# Patient Record
Sex: Female | Born: 1935 | Race: White | Hispanic: No | Smoking: Never smoker
Health system: Southern US, Community
[De-identification: ages and names within clinical notes are randomized; demographics above are authoritative.]

## PROBLEM LIST (undated history)

## (undated) DIAGNOSIS — I509 Heart failure, unspecified: Secondary | ICD-10-CM

## (undated) DIAGNOSIS — I499 Cardiac arrhythmia, unspecified: Secondary | ICD-10-CM

## (undated) DIAGNOSIS — I219 Acute myocardial infarction, unspecified: Secondary | ICD-10-CM

## (undated) DIAGNOSIS — I2510 Atherosclerotic heart disease of native coronary artery without angina pectoris: Secondary | ICD-10-CM

## (undated) DIAGNOSIS — I739 Peripheral vascular disease, unspecified: Secondary | ICD-10-CM

## (undated) DIAGNOSIS — C8590 Non-Hodgkin lymphoma, unspecified, unspecified site: Secondary | ICD-10-CM

## (undated) DIAGNOSIS — Z21 Asymptomatic human immunodeficiency virus [HIV] infection status: Secondary | ICD-10-CM

## (undated) DIAGNOSIS — B20 Human immunodeficiency virus [HIV] disease: Secondary | ICD-10-CM

## (undated) DIAGNOSIS — N189 Chronic kidney disease, unspecified: Secondary | ICD-10-CM

## (undated) HISTORY — PX: JOINT REPLACEMENT: SHX530

## (undated) HISTORY — DX: Chronic kidney disease, unspecified: N18.9

## (undated) HISTORY — DX: Asymptomatic human immunodeficiency virus (hiv) infection status: Z21

## (undated) HISTORY — DX: Cardiac arrhythmia, unspecified: I49.9

## (undated) HISTORY — DX: Human immunodeficiency virus (HIV) disease: B20

## (undated) HISTORY — DX: Acute myocardial infarction, unspecified: I21.9

## (undated) HISTORY — DX: Atherosclerotic heart disease of native coronary artery without angina pectoris: I25.10

## (undated) HISTORY — DX: Heart failure, unspecified: I50.9

## (undated) HISTORY — PX: CORONARY ARTERY BYPASS GRAFT: SHX141

## (undated) HISTORY — DX: Peripheral vascular disease, unspecified: I73.9

---

## 1998-08-22 ENCOUNTER — Ambulatory Visit (HOSPITAL_COMMUNITY): Admission: RE | Admit: 1998-08-22 | Discharge: 1998-08-22 | Payer: Self-pay

## 1998-09-26 ENCOUNTER — Inpatient Hospital Stay (HOSPITAL_COMMUNITY): Admission: EM | Admit: 1998-09-26 | Discharge: 1998-09-30 | Payer: Self-pay | Admitting: Emergency Medicine

## 1998-09-26 ENCOUNTER — Encounter: Payer: Self-pay | Admitting: Emergency Medicine

## 1998-09-27 ENCOUNTER — Encounter: Payer: Self-pay | Admitting: *Deleted

## 1999-03-02 ENCOUNTER — Other Ambulatory Visit: Admission: RE | Admit: 1999-03-02 | Discharge: 1999-03-02 | Payer: Self-pay | Admitting: Family Medicine

## 1999-09-28 ENCOUNTER — Ambulatory Visit (HOSPITAL_COMMUNITY): Admission: RE | Admit: 1999-09-28 | Discharge: 1999-09-28 | Payer: Self-pay | Admitting: Family Medicine

## 2000-02-24 ENCOUNTER — Ambulatory Visit (HOSPITAL_COMMUNITY): Admission: RE | Admit: 2000-02-24 | Discharge: 2000-02-24 | Payer: Self-pay | Admitting: Family Medicine

## 2000-03-30 ENCOUNTER — Ambulatory Visit (HOSPITAL_COMMUNITY): Admission: RE | Admit: 2000-03-30 | Discharge: 2000-03-30 | Payer: Self-pay | Admitting: Gastroenterology

## 2000-03-30 ENCOUNTER — Encounter (INDEPENDENT_AMBULATORY_CARE_PROVIDER_SITE_OTHER): Payer: Self-pay | Admitting: Specialist

## 2000-05-17 ENCOUNTER — Emergency Department (HOSPITAL_COMMUNITY): Admission: EM | Admit: 2000-05-17 | Discharge: 2000-05-17 | Payer: Self-pay | Admitting: *Deleted

## 2000-10-03 ENCOUNTER — Encounter: Payer: Self-pay | Admitting: Family Medicine

## 2000-10-03 ENCOUNTER — Ambulatory Visit (HOSPITAL_COMMUNITY): Admission: RE | Admit: 2000-10-03 | Discharge: 2000-10-03 | Payer: Self-pay | Admitting: Family Medicine

## 2000-10-07 ENCOUNTER — Encounter: Payer: Self-pay | Admitting: Family Medicine

## 2000-10-07 ENCOUNTER — Encounter: Admission: RE | Admit: 2000-10-07 | Discharge: 2000-10-07 | Payer: Self-pay | Admitting: Family Medicine

## 2001-01-06 ENCOUNTER — Encounter: Admission: RE | Admit: 2001-01-06 | Discharge: 2001-01-06 | Payer: Self-pay | Admitting: Family Medicine

## 2001-01-06 ENCOUNTER — Encounter: Payer: Self-pay | Admitting: Family Medicine

## 2001-10-10 ENCOUNTER — Encounter: Payer: Self-pay | Admitting: Family Medicine

## 2001-10-10 ENCOUNTER — Ambulatory Visit (HOSPITAL_COMMUNITY): Admission: RE | Admit: 2001-10-10 | Discharge: 2001-10-10 | Payer: Self-pay | Admitting: Family Medicine

## 2002-03-07 ENCOUNTER — Other Ambulatory Visit: Admission: RE | Admit: 2002-03-07 | Discharge: 2002-03-07 | Payer: Self-pay | Admitting: Family Medicine

## 2002-04-04 ENCOUNTER — Encounter: Admission: RE | Admit: 2002-04-04 | Discharge: 2002-04-04 | Payer: Self-pay | Admitting: Gastroenterology

## 2002-04-04 ENCOUNTER — Encounter: Payer: Self-pay | Admitting: Gastroenterology

## 2002-12-26 ENCOUNTER — Encounter: Payer: Self-pay | Admitting: Family Medicine

## 2002-12-26 ENCOUNTER — Ambulatory Visit (HOSPITAL_COMMUNITY): Admission: RE | Admit: 2002-12-26 | Discharge: 2002-12-26 | Payer: Self-pay | Admitting: Family Medicine

## 2003-04-10 ENCOUNTER — Ambulatory Visit (HOSPITAL_COMMUNITY): Admission: RE | Admit: 2003-04-10 | Discharge: 2003-04-10 | Payer: Self-pay | Admitting: Gastroenterology

## 2003-04-10 ENCOUNTER — Encounter (INDEPENDENT_AMBULATORY_CARE_PROVIDER_SITE_OTHER): Payer: Self-pay | Admitting: Specialist

## 2003-10-30 ENCOUNTER — Inpatient Hospital Stay (HOSPITAL_BASED_OUTPATIENT_CLINIC_OR_DEPARTMENT_OTHER): Admission: RE | Admit: 2003-10-30 | Discharge: 2003-10-30 | Payer: Self-pay | Admitting: Interventional Cardiology

## 2004-03-11 ENCOUNTER — Ambulatory Visit (HOSPITAL_COMMUNITY): Admission: RE | Admit: 2004-03-11 | Discharge: 2004-03-11 | Payer: Self-pay | Admitting: Family Medicine

## 2004-05-27 ENCOUNTER — Other Ambulatory Visit: Admission: RE | Admit: 2004-05-27 | Discharge: 2004-05-27 | Payer: Self-pay | Admitting: Family Medicine

## 2004-12-03 ENCOUNTER — Encounter: Admission: RE | Admit: 2004-12-03 | Discharge: 2004-12-03 | Payer: Self-pay | Admitting: Interventional Cardiology

## 2005-08-10 ENCOUNTER — Ambulatory Visit (HOSPITAL_COMMUNITY): Admission: RE | Admit: 2005-08-10 | Discharge: 2005-08-10 | Payer: Self-pay | Admitting: Family Medicine

## 2006-07-15 ENCOUNTER — Other Ambulatory Visit: Admission: RE | Admit: 2006-07-15 | Discharge: 2006-07-15 | Payer: Self-pay | Admitting: Family Medicine

## 2006-08-30 ENCOUNTER — Ambulatory Visit (HOSPITAL_COMMUNITY): Admission: RE | Admit: 2006-08-30 | Discharge: 2006-08-30 | Payer: Self-pay | Admitting: Family Medicine

## 2008-02-26 ENCOUNTER — Encounter: Admission: RE | Admit: 2008-02-26 | Discharge: 2008-02-26 | Payer: Self-pay | Admitting: Family Medicine

## 2008-05-23 ENCOUNTER — Encounter (INDEPENDENT_AMBULATORY_CARE_PROVIDER_SITE_OTHER): Payer: Self-pay | Admitting: Family Medicine

## 2008-05-23 ENCOUNTER — Ambulatory Visit: Payer: Self-pay | Admitting: Vascular Surgery

## 2008-05-23 ENCOUNTER — Ambulatory Visit (HOSPITAL_COMMUNITY): Admission: RE | Admit: 2008-05-23 | Discharge: 2008-05-23 | Payer: Self-pay | Admitting: Family Medicine

## 2008-11-20 HISTORY — PX: FRACTURE SURGERY: SHX138

## 2009-02-14 ENCOUNTER — Encounter: Admission: RE | Admit: 2009-02-14 | Discharge: 2009-02-14 | Payer: Self-pay | Admitting: Family Medicine

## 2009-02-25 ENCOUNTER — Ambulatory Visit (HOSPITAL_COMMUNITY): Admission: RE | Admit: 2009-02-25 | Discharge: 2009-02-26 | Payer: Self-pay | Admitting: Neurological Surgery

## 2009-02-25 ENCOUNTER — Encounter (INDEPENDENT_AMBULATORY_CARE_PROVIDER_SITE_OTHER): Payer: Self-pay | Admitting: Neurological Surgery

## 2009-02-27 ENCOUNTER — Ambulatory Visit: Payer: Self-pay | Admitting: Oncology

## 2009-03-03 LAB — LACTATE DEHYDROGENASE: LDH: 214 U/L (ref 94–250)

## 2009-03-03 LAB — CBC WITH DIFFERENTIAL/PLATELET
Basophils Absolute: 0 10*3/uL (ref 0.0–0.1)
Eosinophils Absolute: 0.2 10*3/uL (ref 0.0–0.5)
HGB: 12.1 g/dL (ref 11.6–15.9)
MCV: 79.5 fL (ref 79.5–101.0)
MONO#: 0.6 10*3/uL (ref 0.1–0.9)
MONO%: 8.3 % (ref 0.0–14.0)
NEUT#: 4.8 10*3/uL (ref 1.5–6.5)
RDW: 16.3 % — ABNORMAL HIGH (ref 11.2–14.5)
WBC: 6.8 10*3/uL (ref 3.9–10.3)
lymph#: 1.2 10*3/uL (ref 0.9–3.3)

## 2009-03-03 LAB — COMPREHENSIVE METABOLIC PANEL WITH GFR
ALT: 13 U/L (ref 0–35)
AST: 22 U/L (ref 0–37)
Albumin: 4 g/dL (ref 3.5–5.2)
Alkaline Phosphatase: 84 U/L (ref 39–117)
BUN: 21 mg/dL (ref 6–23)
CO2: 28 meq/L (ref 19–32)
Calcium: 9.4 mg/dL (ref 8.4–10.5)
Chloride: 102 meq/L (ref 96–112)
Creatinine, Ser: 1.1 mg/dL (ref 0.40–1.20)
Glucose, Bld: 105 mg/dL — ABNORMAL HIGH (ref 70–99)
Potassium: 3.8 meq/L (ref 3.5–5.3)
Sodium: 142 meq/L (ref 135–145)
Total Bilirubin: 0.6 mg/dL (ref 0.3–1.2)
Total Protein: 6.5 g/dL (ref 6.0–8.3)

## 2009-03-04 ENCOUNTER — Ambulatory Visit: Admission: RE | Admit: 2009-03-04 | Discharge: 2009-04-15 | Payer: Self-pay | Admitting: Radiation Oncology

## 2009-03-07 ENCOUNTER — Ambulatory Visit (HOSPITAL_COMMUNITY): Admission: RE | Admit: 2009-03-07 | Discharge: 2009-03-07 | Payer: Self-pay | Admitting: Internal Medicine

## 2009-03-09 ENCOUNTER — Encounter: Admission: RE | Admit: 2009-03-09 | Discharge: 2009-03-09 | Payer: Self-pay | Admitting: Radiation Oncology

## 2009-03-10 ENCOUNTER — Encounter (INDEPENDENT_AMBULATORY_CARE_PROVIDER_SITE_OTHER): Payer: Self-pay | Admitting: Diagnostic Radiology

## 2009-03-10 ENCOUNTER — Ambulatory Visit (HOSPITAL_COMMUNITY): Admission: RE | Admit: 2009-03-10 | Discharge: 2009-03-10 | Payer: Self-pay | Admitting: Radiation Oncology

## 2009-03-17 LAB — CBC WITH DIFFERENTIAL/PLATELET
BASO%: 0 % (ref 0.0–2.0)
Eosinophils Absolute: 0.1 10*3/uL (ref 0.0–0.5)
MCHC: 32.9 g/dL (ref 31.5–36.0)
MONO#: 0 10*3/uL — ABNORMAL LOW (ref 0.1–0.9)
NEUT#: 0.2 10*3/uL — CL (ref 1.5–6.5)
Platelets: 122 10*3/uL — ABNORMAL LOW (ref 145–400)
RBC: 4.32 10*6/uL (ref 3.70–5.45)
WBC: 0.6 10*3/uL — CL (ref 3.9–10.3)
lymph#: 0.3 10*3/uL — ABNORMAL LOW (ref 0.9–3.3)
nRBC: 0 % (ref 0–0)

## 2009-03-17 LAB — COMPREHENSIVE METABOLIC PANEL
ALT: 29 U/L (ref 0–35)
AST: 17 U/L (ref 0–37)
Albumin: 3.4 g/dL — ABNORMAL LOW (ref 3.5–5.2)
CO2: 32 mEq/L (ref 19–32)
Calcium: 8.8 mg/dL (ref 8.4–10.5)
Chloride: 97 mEq/L (ref 96–112)
Potassium: 3.4 mEq/L — ABNORMAL LOW (ref 3.5–5.3)
Sodium: 138 mEq/L (ref 135–145)
Total Protein: 5.6 g/dL — ABNORMAL LOW (ref 6.0–8.3)

## 2009-03-17 LAB — LACTATE DEHYDROGENASE: LDH: 137 U/L (ref 94–250)

## 2009-03-24 LAB — COMPREHENSIVE METABOLIC PANEL
AST: 16 U/L (ref 0–37)
Alkaline Phosphatase: 90 U/L (ref 39–117)
BUN: 14 mg/dL (ref 6–23)
Glucose, Bld: 104 mg/dL — ABNORMAL HIGH (ref 70–99)
Sodium: 137 mEq/L (ref 135–145)
Total Bilirubin: 0.5 mg/dL (ref 0.3–1.2)
Total Protein: 6 g/dL (ref 6.0–8.3)

## 2009-03-24 LAB — CBC WITH DIFFERENTIAL/PLATELET
EOS%: 0.2 % (ref 0.0–7.0)
Eosinophils Absolute: 0 10*3/uL (ref 0.0–0.5)
LYMPH%: 5.2 % — ABNORMAL LOW (ref 14.0–49.7)
MCH: 26.3 pg (ref 25.1–34.0)
MCHC: 33 g/dL (ref 31.5–36.0)
MCV: 79.7 fL (ref 79.5–101.0)
MONO%: 6 % (ref 0.0–14.0)
NEUT#: 10.6 10*3/uL — ABNORMAL HIGH (ref 1.5–6.5)
Platelets: 234 10*3/uL (ref 145–400)
RBC: 4.39 10*6/uL (ref 3.70–5.45)

## 2009-04-01 LAB — COMPREHENSIVE METABOLIC PANEL
CO2: 30 mEq/L (ref 19–32)
Calcium: 9.7 mg/dL (ref 8.4–10.5)
Creatinine, Ser: 1.86 mg/dL — ABNORMAL HIGH (ref 0.40–1.20)
Glucose, Bld: 128 mg/dL — ABNORMAL HIGH (ref 70–99)
Total Bilirubin: 0.4 mg/dL (ref 0.3–1.2)
Total Protein: 6.1 g/dL (ref 6.0–8.3)

## 2009-04-01 LAB — CBC WITH DIFFERENTIAL/PLATELET
Basophils Absolute: 0 10*3/uL (ref 0.0–0.1)
Eosinophils Absolute: 0 10*3/uL (ref 0.0–0.5)
HCT: 34.1 % — ABNORMAL LOW (ref 34.8–46.6)
HGB: 11.2 g/dL — ABNORMAL LOW (ref 11.6–15.9)
LYMPH%: 8.4 % — ABNORMAL LOW (ref 14.0–49.7)
MONO#: 0.8 10*3/uL (ref 0.1–0.9)
NEUT#: 7.4 10*3/uL — ABNORMAL HIGH (ref 1.5–6.5)
NEUT%: 82.4 % — ABNORMAL HIGH (ref 38.4–76.8)
Platelets: 591 10*3/uL — ABNORMAL HIGH (ref 145–400)
WBC: 9 10*3/uL (ref 3.9–10.3)

## 2009-04-01 LAB — LACTATE DEHYDROGENASE: LDH: 155 U/L (ref 94–250)

## 2009-04-09 LAB — CBC WITH DIFFERENTIAL/PLATELET
Basophils Absolute: 0 10*3/uL (ref 0.0–0.1)
Eosinophils Absolute: 0.1 10*3/uL (ref 0.0–0.5)
HCT: 27.9 % — ABNORMAL LOW (ref 34.8–46.6)
HGB: 9.5 g/dL — ABNORMAL LOW (ref 11.6–15.9)
MONO#: 0 10*3/uL — ABNORMAL LOW (ref 0.1–0.9)
NEUT%: 14.9 % — ABNORMAL LOW (ref 38.4–76.8)
WBC: 0.4 10*3/uL — CL (ref 3.9–10.3)
lymph#: 0.2 10*3/uL — ABNORMAL LOW (ref 0.9–3.3)

## 2009-04-09 LAB — COMPREHENSIVE METABOLIC PANEL
ALT: 20 U/L (ref 0–35)
BUN: 34 mg/dL — ABNORMAL HIGH (ref 6–23)
CO2: 28 mEq/L (ref 19–32)
Calcium: 8.3 mg/dL — ABNORMAL LOW (ref 8.4–10.5)
Chloride: 99 mEq/L (ref 96–112)
Creatinine, Ser: 0.99 mg/dL (ref 0.40–1.20)

## 2009-04-09 LAB — LACTATE DEHYDROGENASE: LDH: 112 U/L (ref 94–250)

## 2009-04-14 ENCOUNTER — Ambulatory Visit: Payer: Self-pay | Admitting: Oncology

## 2009-04-16 LAB — COMPREHENSIVE METABOLIC PANEL
AST: 149 U/L — ABNORMAL HIGH (ref 0–37)
Albumin: 3.1 g/dL — ABNORMAL LOW (ref 3.5–5.2)
Alkaline Phosphatase: 120 U/L — ABNORMAL HIGH (ref 39–117)
Potassium: 4.3 mEq/L (ref 3.5–5.3)
Sodium: 140 mEq/L (ref 135–145)
Total Protein: 5.1 g/dL — ABNORMAL LOW (ref 6.0–8.3)

## 2009-04-16 LAB — CBC WITH DIFFERENTIAL/PLATELET
BASO%: 0.6 % (ref 0.0–2.0)
EOS%: 0.5 % (ref 0.0–7.0)
HGB: 8.5 g/dL — ABNORMAL LOW (ref 11.6–15.9)
MCH: 25.8 pg (ref 25.1–34.0)
MCHC: 32.3 g/dL (ref 31.5–36.0)
RBC: 3.29 10*6/uL — ABNORMAL LOW (ref 3.70–5.45)
RDW: 18.9 % — ABNORMAL HIGH (ref 11.2–14.5)
lymph#: 0.7 10*3/uL — ABNORMAL LOW (ref 0.9–3.3)

## 2009-04-16 LAB — TECHNOLOGIST REVIEW: Technologist Review: 2

## 2009-04-22 LAB — CBC WITH DIFFERENTIAL/PLATELET
BASO%: 0.1 % (ref 0.0–2.0)
Eosinophils Absolute: 0 10*3/uL (ref 0.0–0.5)
HCT: 32.2 % — ABNORMAL LOW (ref 34.8–46.6)
LYMPH%: 3 % — ABNORMAL LOW (ref 14.0–49.7)
MCHC: 34.5 g/dL (ref 31.5–36.0)
MCV: 82.4 fL (ref 79.5–101.0)
MONO%: 6.2 % (ref 0.0–14.0)
NEUT%: 90.1 % — ABNORMAL HIGH (ref 38.4–76.8)
Platelets: 420 10*3/uL — ABNORMAL HIGH (ref 145–400)
RBC: 3.91 10*6/uL (ref 3.70–5.45)

## 2009-04-22 LAB — COMPREHENSIVE METABOLIC PANEL
Alkaline Phosphatase: 112 U/L (ref 39–117)
Creatinine, Ser: 0.84 mg/dL (ref 0.40–1.20)
Glucose, Bld: 127 mg/dL — ABNORMAL HIGH (ref 70–99)
Sodium: 141 mEq/L (ref 135–145)
Total Bilirubin: 1 mg/dL (ref 0.3–1.2)
Total Protein: 5.4 g/dL — ABNORMAL LOW (ref 6.0–8.3)

## 2009-04-30 ENCOUNTER — Inpatient Hospital Stay (HOSPITAL_COMMUNITY): Admission: EM | Admit: 2009-04-30 | Discharge: 2009-05-07 | Payer: Self-pay | Admitting: Emergency Medicine

## 2009-04-30 ENCOUNTER — Ambulatory Visit: Payer: Self-pay | Admitting: Internal Medicine

## 2009-04-30 LAB — COMPREHENSIVE METABOLIC PANEL
AST: 14 U/L (ref 0–37)
Alkaline Phosphatase: 74 U/L (ref 39–117)
BUN: 30 mg/dL — ABNORMAL HIGH (ref 6–23)
Creatinine, Ser: 0.85 mg/dL (ref 0.40–1.20)
Total Bilirubin: 1.7 mg/dL — ABNORMAL HIGH (ref 0.3–1.2)

## 2009-04-30 LAB — CBC WITH DIFFERENTIAL/PLATELET
HGB: 8.5 g/dL — ABNORMAL LOW (ref 11.6–15.9)
MCH: 26.6 pg (ref 25.1–34.0)
MCHC: 32.6 g/dL (ref 31.5–36.0)
RBC: 3.2 10*6/uL — ABNORMAL LOW (ref 3.70–5.45)
RDW: 21.5 % — ABNORMAL HIGH (ref 11.2–14.5)
nRBC: 0 % (ref 0–0)

## 2009-05-01 ENCOUNTER — Encounter: Payer: Self-pay | Admitting: Internal Medicine

## 2009-05-03 ENCOUNTER — Ambulatory Visit: Payer: Self-pay | Admitting: Oncology

## 2009-05-09 ENCOUNTER — Ambulatory Visit (HOSPITAL_COMMUNITY): Admission: RE | Admit: 2009-05-09 | Discharge: 2009-05-09 | Payer: Self-pay | Admitting: Internal Medicine

## 2009-05-13 LAB — COMPREHENSIVE METABOLIC PANEL
ALT: 14 U/L (ref 0–35)
AST: 16 U/L (ref 0–37)
Albumin: 3.4 g/dL — ABNORMAL LOW (ref 3.5–5.2)
BUN: 17 mg/dL (ref 6–23)
Calcium: 9.2 mg/dL (ref 8.4–10.5)
Chloride: 97 mEq/L (ref 96–112)
Potassium: 3.6 mEq/L (ref 3.5–5.3)

## 2009-05-13 LAB — CBC WITH DIFFERENTIAL/PLATELET
BASO%: 0 % (ref 0.0–2.0)
EOS%: 0.1 % (ref 0.0–7.0)
MCH: 29.1 pg (ref 25.1–34.0)
MCHC: 33.6 g/dL (ref 31.5–36.0)
RBC: 4.33 10*6/uL (ref 3.70–5.45)
RDW: 21.2 % — ABNORMAL HIGH (ref 11.2–14.5)
lymph#: 0.3 10*3/uL — ABNORMAL LOW (ref 0.9–3.3)

## 2009-05-19 ENCOUNTER — Ambulatory Visit: Payer: Self-pay | Admitting: Oncology

## 2009-05-21 LAB — CBC WITH DIFFERENTIAL/PLATELET
EOS%: 0.8 % (ref 0.0–7.0)
MCH: 29 pg (ref 25.1–34.0)
MCHC: 33.3 g/dL (ref 31.5–36.0)
MCV: 87.1 fL (ref 79.5–101.0)
MONO%: 6.3 % (ref 0.0–14.0)
RBC: 4.2 10*6/uL (ref 3.70–5.45)
RDW: 21.8 % — ABNORMAL HIGH (ref 11.2–14.5)

## 2009-05-21 LAB — COMPREHENSIVE METABOLIC PANEL
AST: 19 U/L (ref 0–37)
Albumin: 3.5 g/dL (ref 3.5–5.2)
Alkaline Phosphatase: 72 U/L (ref 39–117)
BUN: 28 mg/dL — ABNORMAL HIGH (ref 6–23)
Potassium: 4.2 mEq/L (ref 3.5–5.3)
Sodium: 141 mEq/L (ref 135–145)
Total Protein: 5.3 g/dL — ABNORMAL LOW (ref 6.0–8.3)

## 2009-05-28 LAB — CBC WITH DIFFERENTIAL/PLATELET
Eosinophils Absolute: 0.2 10*3/uL (ref 0.0–0.5)
HCT: 32.6 % — ABNORMAL LOW (ref 34.8–46.6)
LYMPH%: 20 % (ref 14.0–49.7)
MCV: 88.1 fL (ref 79.5–101.0)
MONO%: 1.7 % (ref 0.0–14.0)
NEUT#: 0.3 10*3/uL — CL (ref 1.5–6.5)
NEUT%: 53.3 % (ref 38.4–76.8)
Platelets: 80 10*3/uL — ABNORMAL LOW (ref 145–400)
RBC: 3.7 10*6/uL (ref 3.70–5.45)
nRBC: 0 % (ref 0–0)

## 2009-05-28 LAB — COMPREHENSIVE METABOLIC PANEL
CO2: 31 mEq/L (ref 19–32)
Creatinine, Ser: 1.09 mg/dL (ref 0.40–1.20)
Glucose, Bld: 153 mg/dL — ABNORMAL HIGH (ref 70–99)
Total Bilirubin: 1.7 mg/dL — ABNORMAL HIGH (ref 0.3–1.2)

## 2009-05-28 LAB — LACTATE DEHYDROGENASE: LDH: 146 U/L (ref 94–250)

## 2009-06-02 ENCOUNTER — Ambulatory Visit: Payer: Self-pay | Admitting: Internal Medicine

## 2009-06-02 ENCOUNTER — Inpatient Hospital Stay (HOSPITAL_COMMUNITY): Admission: EM | Admit: 2009-06-02 | Discharge: 2009-06-03 | Payer: Self-pay | Admitting: Emergency Medicine

## 2009-06-10 LAB — CBC WITH DIFFERENTIAL/PLATELET
BASO%: 0.6 % (ref 0.0–2.0)
Eosinophils Absolute: 0 10*3/uL (ref 0.0–0.5)
HCT: 27.3 % — ABNORMAL LOW (ref 34.8–46.6)
LYMPH%: 13.7 % — ABNORMAL LOW (ref 14.0–49.7)
MCHC: 33.7 g/dL (ref 31.5–36.0)
MONO#: 0.1 10*3/uL (ref 0.1–0.9)
NEUT#: 6.8 10*3/uL — ABNORMAL HIGH (ref 1.5–6.5)
Platelets: 308 10*3/uL (ref 145–400)
RBC: 2.94 10*6/uL — ABNORMAL LOW (ref 3.70–5.45)
WBC: 8 10*3/uL (ref 3.9–10.3)
lymph#: 1.1 10*3/uL (ref 0.9–3.3)

## 2009-06-10 LAB — COMPREHENSIVE METABOLIC PANEL
ALT: 13 U/L (ref 0–35)
Albumin: 3.3 g/dL — ABNORMAL LOW (ref 3.5–5.2)
CO2: 28 mEq/L (ref 19–32)
Calcium: 8.8 mg/dL (ref 8.4–10.5)
Chloride: 98 mEq/L (ref 96–112)
Glucose, Bld: 130 mg/dL — ABNORMAL HIGH (ref 70–99)
Sodium: 138 mEq/L (ref 135–145)
Total Protein: 5.2 g/dL — ABNORMAL LOW (ref 6.0–8.3)

## 2009-06-10 LAB — LACTATE DEHYDROGENASE: LDH: 226 U/L (ref 94–250)

## 2009-06-16 ENCOUNTER — Ambulatory Visit: Payer: Self-pay | Admitting: Oncology

## 2009-06-16 LAB — COMPREHENSIVE METABOLIC PANEL
AST: 19 U/L (ref 0–37)
Albumin: 3.6 g/dL (ref 3.5–5.2)
Alkaline Phosphatase: 78 U/L (ref 39–117)
BUN: 15 mg/dL (ref 6–23)
Glucose, Bld: 156 mg/dL — ABNORMAL HIGH (ref 70–99)
Potassium: 3.9 mEq/L (ref 3.5–5.3)
Sodium: 138 mEq/L (ref 135–145)
Total Bilirubin: 0.8 mg/dL (ref 0.3–1.2)

## 2009-06-16 LAB — CBC WITH DIFFERENTIAL/PLATELET
Basophils Absolute: 0 10*3/uL (ref 0.0–0.1)
EOS%: 0.9 % (ref 0.0–7.0)
Eosinophils Absolute: 0.1 10*3/uL (ref 0.0–0.5)
LYMPH%: 22.3 % (ref 14.0–49.7)
MCH: 31.7 pg (ref 25.1–34.0)
MCV: 95.3 fL (ref 79.5–101.0)
MONO%: 6.9 % (ref 0.0–14.0)
Platelets: 230 10*3/uL (ref 145–400)
RBC: 3.21 10*6/uL — ABNORMAL LOW (ref 3.70–5.45)
RDW: 26.8 % — ABNORMAL HIGH (ref 11.2–14.5)

## 2009-06-25 ENCOUNTER — Encounter (HOSPITAL_COMMUNITY): Admission: RE | Admit: 2009-06-25 | Discharge: 2009-08-07 | Payer: Self-pay | Admitting: Internal Medicine

## 2009-06-25 LAB — CBC WITH DIFFERENTIAL/PLATELET
Basophils Absolute: 0 10*3/uL (ref 0.0–0.1)
Eosinophils Absolute: 0.1 10*3/uL (ref 0.0–0.5)
HCT: 23.8 % — ABNORMAL LOW (ref 34.8–46.6)
HGB: 8.2 g/dL — ABNORMAL LOW (ref 11.6–15.9)
MONO#: 0 10*3/uL — ABNORMAL LOW (ref 0.1–0.9)
NEUT#: 0.1 10*3/uL — CL (ref 1.5–6.5)
NEUT%: 33 % — ABNORMAL LOW (ref 38.4–76.8)
WBC: 0.4 10*3/uL — CL (ref 3.9–10.3)
lymph#: 0.1 10*3/uL — ABNORMAL LOW (ref 0.9–3.3)

## 2009-06-25 LAB — COMPREHENSIVE METABOLIC PANEL
ALT: 47 U/L — ABNORMAL HIGH (ref 0–35)
Albumin: 3.5 g/dL (ref 3.5–5.2)
BUN: 28 mg/dL — ABNORMAL HIGH (ref 6–23)
CO2: 27 mEq/L (ref 19–32)
Calcium: 8.6 mg/dL (ref 8.4–10.5)
Chloride: 98 mEq/L (ref 96–112)
Creatinine, Ser: 0.89 mg/dL (ref 0.40–1.20)

## 2009-06-25 LAB — LACTATE DEHYDROGENASE: LDH: 143 U/L (ref 94–250)

## 2009-07-02 ENCOUNTER — Ambulatory Visit (HOSPITAL_COMMUNITY): Admission: RE | Admit: 2009-07-02 | Discharge: 2009-07-02 | Payer: Self-pay | Admitting: Internal Medicine

## 2009-07-02 LAB — COMPREHENSIVE METABOLIC PANEL
ALT: 22 U/L (ref 0–35)
AST: 17 U/L (ref 0–37)
Albumin: 3.3 g/dL — ABNORMAL LOW (ref 3.5–5.2)
Alkaline Phosphatase: 96 U/L (ref 39–117)
Glucose, Bld: 131 mg/dL — ABNORMAL HIGH (ref 70–99)
Potassium: 3.9 mEq/L (ref 3.5–5.3)
Sodium: 135 mEq/L (ref 135–145)
Total Bilirubin: 1.1 mg/dL (ref 0.3–1.2)
Total Protein: 5 g/dL — ABNORMAL LOW (ref 6.0–8.3)

## 2009-07-02 LAB — CBC WITH DIFFERENTIAL/PLATELET
BASO%: 0 % (ref 0.0–2.0)
Eosinophils Absolute: 0 10*3/uL (ref 0.0–0.5)
MCHC: 33.9 g/dL (ref 31.5–36.0)
MCV: 97.1 fL (ref 79.5–101.0)
MONO%: 2.5 % (ref 0.0–14.0)
NEUT#: 7.3 10*3/uL — ABNORMAL HIGH (ref 1.5–6.5)
RBC: 2.94 10*6/uL — ABNORMAL LOW (ref 3.70–5.45)
RDW: 20.8 % — ABNORMAL HIGH (ref 11.2–14.5)
WBC: 7.6 10*3/uL (ref 3.9–10.3)

## 2009-07-02 LAB — LACTATE DEHYDROGENASE: LDH: 170 U/L (ref 94–250)

## 2009-07-07 LAB — COMPREHENSIVE METABOLIC PANEL
ALT: 15 U/L (ref 0–35)
Albumin: 3.4 g/dL — ABNORMAL LOW (ref 3.5–5.2)
CO2: 26 mEq/L (ref 19–32)
Calcium: 8.9 mg/dL (ref 8.4–10.5)
Chloride: 98 mEq/L (ref 96–112)
Creatinine, Ser: 0.77 mg/dL (ref 0.40–1.20)
Sodium: 137 mEq/L (ref 135–145)
Total Protein: 5.2 g/dL — ABNORMAL LOW (ref 6.0–8.3)

## 2009-07-07 LAB — CBC WITH DIFFERENTIAL/PLATELET
BASO%: 0.1 % (ref 0.0–2.0)
HCT: 30.7 % — ABNORMAL LOW (ref 34.8–46.6)
MCHC: 33.9 g/dL (ref 31.5–36.0)
MONO#: 0.7 10*3/uL (ref 0.1–0.9)
NEUT%: 85.2 % — ABNORMAL HIGH (ref 38.4–76.8)
WBC: 8.4 10*3/uL (ref 3.9–10.3)
lymph#: 0.6 10*3/uL — ABNORMAL LOW (ref 0.9–3.3)

## 2009-07-07 LAB — LACTATE DEHYDROGENASE: LDH: 176 U/L (ref 94–250)

## 2009-07-16 ENCOUNTER — Inpatient Hospital Stay (HOSPITAL_COMMUNITY): Admission: AD | Admit: 2009-07-16 | Discharge: 2009-07-25 | Payer: Self-pay | Admitting: Internal Medicine

## 2009-07-16 ENCOUNTER — Ambulatory Visit: Payer: Self-pay | Admitting: Oncology

## 2009-07-16 ENCOUNTER — Ambulatory Visit: Payer: Self-pay | Admitting: Internal Medicine

## 2009-07-16 LAB — CBC WITH DIFFERENTIAL/PLATELET
HCT: 28.2 % — ABNORMAL LOW (ref 34.8–46.6)
MCHC: 32.6 g/dL (ref 31.5–36.0)
MCV: 95.9 fL (ref 79.5–101.0)
Platelets: 50 10*3/uL — ABNORMAL LOW (ref 145–400)
RBC: 2.94 10*6/uL — ABNORMAL LOW (ref 3.70–5.45)

## 2009-07-16 LAB — COMPREHENSIVE METABOLIC PANEL
Albumin: 3.5 g/dL (ref 3.5–5.2)
Alkaline Phosphatase: 87 U/L (ref 39–117)
BUN: 26 mg/dL — ABNORMAL HIGH (ref 6–23)
CO2: 24 mEq/L (ref 19–32)
Glucose, Bld: 201 mg/dL — ABNORMAL HIGH (ref 70–99)
Total Bilirubin: 2 mg/dL — ABNORMAL HIGH (ref 0.3–1.2)
Total Protein: 5.2 g/dL — ABNORMAL LOW (ref 6.0–8.3)

## 2009-07-16 LAB — LACTATE DEHYDROGENASE: LDH: 155 U/L (ref 94–250)

## 2009-08-04 ENCOUNTER — Ambulatory Visit (HOSPITAL_COMMUNITY): Admission: RE | Admit: 2009-08-04 | Discharge: 2009-08-04 | Payer: Self-pay | Admitting: Internal Medicine

## 2009-08-13 LAB — CBC WITH DIFFERENTIAL/PLATELET
BASO%: 0.2 % (ref 0.0–2.0)
EOS%: 3.3 % (ref 0.0–7.0)
LYMPH%: 12.8 % — ABNORMAL LOW (ref 14.0–49.7)
MCH: 33.6 pg (ref 25.1–34.0)
MCHC: 34.3 g/dL (ref 31.5–36.0)
MONO#: 0.4 10*3/uL (ref 0.1–0.9)
MONO%: 9.7 % (ref 0.0–14.0)
NEUT%: 74 % (ref 38.4–76.8)
Platelets: 202 10*3/uL (ref 145–400)
RBC: 3.55 10*6/uL — ABNORMAL LOW (ref 3.70–5.45)
WBC: 4.6 10*3/uL (ref 3.9–10.3)

## 2009-08-13 LAB — COMPREHENSIVE METABOLIC PANEL
ALT: 15 U/L (ref 0–35)
AST: 17 U/L (ref 0–37)
Alkaline Phosphatase: 80 U/L (ref 39–117)
CO2: 30 mEq/L (ref 19–32)
Creatinine, Ser: 0.74 mg/dL (ref 0.40–1.20)
Sodium: 144 mEq/L (ref 135–145)
Total Bilirubin: 0.7 mg/dL (ref 0.3–1.2)
Total Protein: 5.4 g/dL — ABNORMAL LOW (ref 6.0–8.3)

## 2009-11-05 ENCOUNTER — Ambulatory Visit: Payer: Self-pay | Admitting: Oncology

## 2009-11-10 ENCOUNTER — Ambulatory Visit (HOSPITAL_COMMUNITY): Admission: RE | Admit: 2009-11-10 | Discharge: 2009-11-10 | Payer: Self-pay | Admitting: Internal Medicine

## 2009-11-10 LAB — CBC WITH DIFFERENTIAL/PLATELET
Eosinophils Absolute: 0 10*3/uL (ref 0.0–0.5)
LYMPH%: 12.8 % — ABNORMAL LOW (ref 14.0–49.7)
MONO#: 0.2 10*3/uL (ref 0.1–0.9)
NEUT#: 4.4 10*3/uL (ref 1.5–6.5)
Platelets: 182 10*3/uL (ref 145–400)
RBC: 4.47 10*6/uL (ref 3.70–5.45)
RDW: 14.7 % — ABNORMAL HIGH (ref 11.2–14.5)
WBC: 5.3 10*3/uL (ref 3.9–10.3)

## 2009-11-10 LAB — COMPREHENSIVE METABOLIC PANEL
Albumin: 4 g/dL (ref 3.5–5.2)
CO2: 31 mEq/L (ref 19–32)
Calcium: 9.7 mg/dL (ref 8.4–10.5)
Glucose, Bld: 106 mg/dL — ABNORMAL HIGH (ref 70–99)
Potassium: 3.7 mEq/L (ref 3.5–5.3)
Sodium: 141 mEq/L (ref 135–145)
Total Protein: 6.1 g/dL (ref 6.0–8.3)

## 2009-11-10 LAB — LACTATE DEHYDROGENASE: LDH: 192 U/L (ref 94–250)

## 2010-01-08 ENCOUNTER — Ambulatory Visit: Payer: Self-pay | Admitting: Oncology

## 2010-01-09 ENCOUNTER — Encounter: Admission: RE | Admit: 2010-01-09 | Discharge: 2010-01-09 | Payer: Self-pay | Admitting: Family Medicine

## 2010-01-12 LAB — COMPREHENSIVE METABOLIC PANEL
Albumin: 4 g/dL (ref 3.5–5.2)
Alkaline Phosphatase: 92 U/L (ref 39–117)
BUN: 20 mg/dL (ref 6–23)
Creatinine, Ser: 0.86 mg/dL (ref 0.40–1.20)
Glucose, Bld: 97 mg/dL (ref 70–99)
Total Bilirubin: 1 mg/dL (ref 0.3–1.2)

## 2010-01-12 LAB — CBC WITH DIFFERENTIAL/PLATELET
Basophils Absolute: 0 10*3/uL (ref 0.0–0.1)
EOS%: 2.1 % (ref 0.0–7.0)
HCT: 39.3 % (ref 34.8–46.6)
HGB: 13.1 g/dL (ref 11.6–15.9)
LYMPH%: 21 % (ref 14.0–49.7)
MCH: 29.6 pg (ref 25.1–34.0)
MCV: 88.7 fL (ref 79.5–101.0)
MONO%: 10.9 % (ref 0.0–14.0)
NEUT%: 65.4 % (ref 38.4–76.8)

## 2010-03-11 ENCOUNTER — Ambulatory Visit: Payer: Self-pay | Admitting: Internal Medicine

## 2010-03-12 LAB — CBC WITH DIFFERENTIAL/PLATELET
BASO%: 0.9 % (ref 0.0–2.0)
LYMPH%: 19.5 % (ref 14.0–49.7)
MCHC: 32.7 g/dL (ref 31.5–36.0)
MONO#: 0.5 10*3/uL (ref 0.1–0.9)
MONO%: 11.7 % (ref 0.0–14.0)
Platelets: 162 10*3/uL (ref 145–400)
RBC: 4.23 10*6/uL (ref 3.70–5.45)
RDW: 16.6 % — ABNORMAL HIGH (ref 11.2–14.5)
WBC: 4.6 10*3/uL (ref 3.9–10.3)
nRBC: 0 % (ref 0–0)

## 2010-03-12 LAB — COMPREHENSIVE METABOLIC PANEL
ALT: 32 U/L (ref 0–35)
AST: 31 U/L (ref 0–37)
Chloride: 102 mEq/L (ref 96–112)
Creatinine, Ser: 0.75 mg/dL (ref 0.40–1.20)
Sodium: 141 mEq/L (ref 135–145)
Total Bilirubin: 0.9 mg/dL (ref 0.3–1.2)
Total Protein: 5.6 g/dL — ABNORMAL LOW (ref 6.0–8.3)

## 2010-04-15 IMAGING — PT NM PET TUM IMG RESTAG (PS) SKULL BASE T - THIGH
8 series · 25 of 25 positions shown · non-contrast
Comparison: 03/07/2009

CLINICAL DATA: Subsequent treatment strategy for lymphoma.

NUCLEAR MEDICINE PET CT INITIAL (PI) SKULL BASE TO THIGH
TECHNIQUE: 16.8 mCi F-18 FDG was injected intravenously via the
left antecubital fossa.  Full-ring PET imaging was performed from
the skull base through the mid-thighs 97  minutes after injection.
CT data was obtained and used for attenuation correction and
anatomic localization only.  (This was not acquired as a diagnostic
CT examination.)
Fasting Blood Glucose:  109

[Series 1: pet ac · axial · 3.3mm · 4.69mm/px · z∈[-870,+0]mm · 6 of 267 slices shown]
[im 1/267]
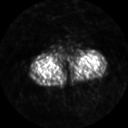
[im 54/267]
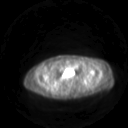
[im 107/267]
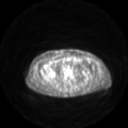
[im 160/267]
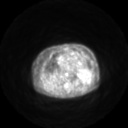
[im 213/267]
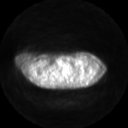
[im 267/267]
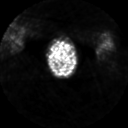

[Series 2: pet nac · axial · 3.3mm · 4.69mm/px · z∈[-870,+0]mm · 5 of 267 slices shown]
[im 1/267]
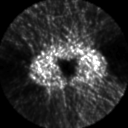
[im 67/267]
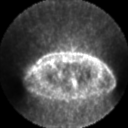
[im 134/267]
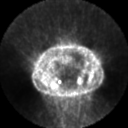
[im 200/267]
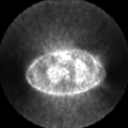
[im 267/267]
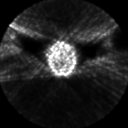

[Series 2: ct images · axial · 3.8mm · 0.98mm/px · z∈[-870,+0]mm · 5 of 267 slices shown]
[im 1/267]
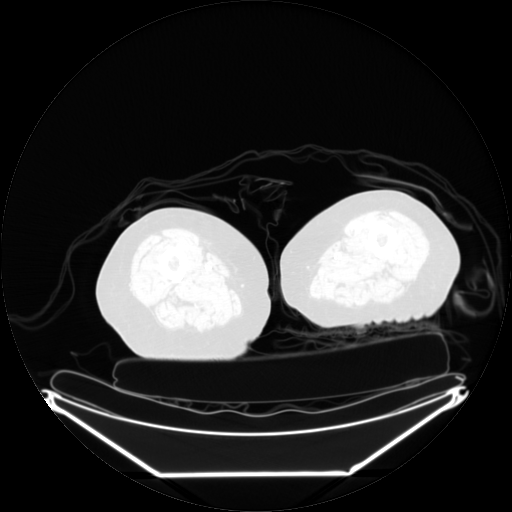
[im 67/267]
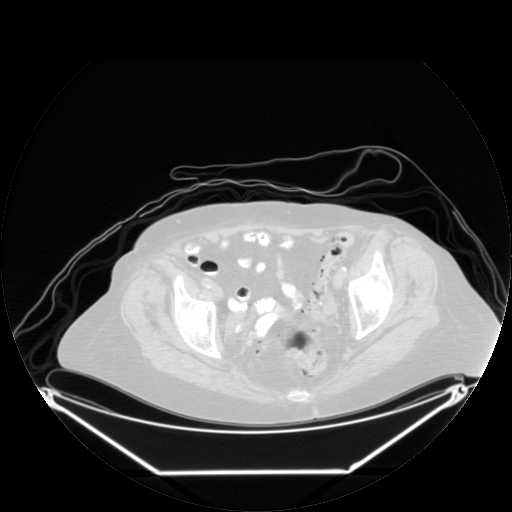
[im 134/267]
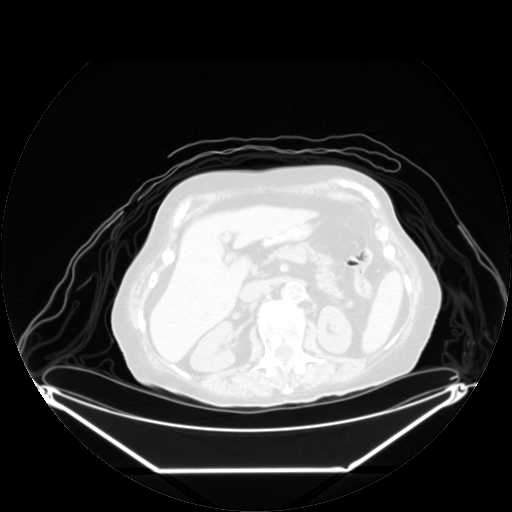
[im 200/267]
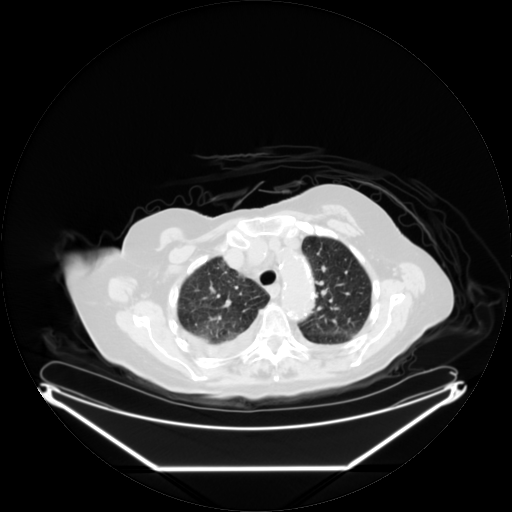
[im 267/267  brain]
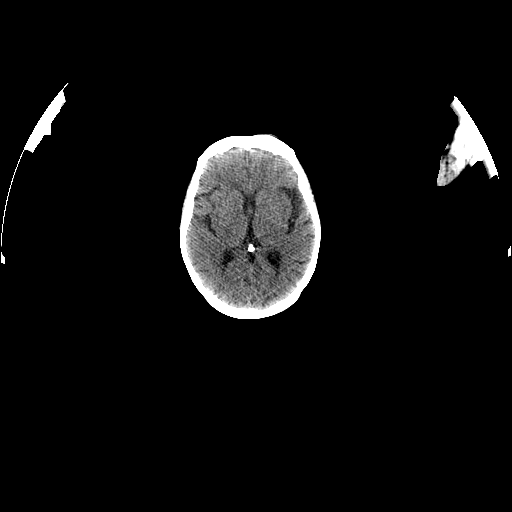

[Series 123: mip · coronal · 3.3mm · 4.69mm/px · 1 of 30 slices shown]
[im 1/30]
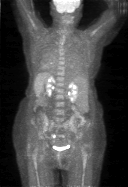

[Series 151: reformatted · axial · 3.3mm · 3.91mm/px · z∈[-870,+0]mm · 5 of 267 slices shown (1 of 4)]
[im 1/267]
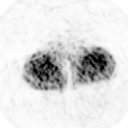
[im 67/267]
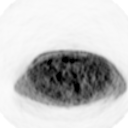
[im 134/267]
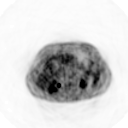
[im 200/267]
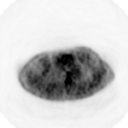
[im 267/267]
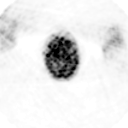

[Series 153: reformatted · coronal · 4.7mm · 6.98mm/px · 1 of 71 slices shown (2 of 4)]
[im 1/71]
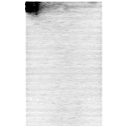

[Series 153: reformatted · coronal · 4.7mm · 6.98mm/px · 1 of 71 slices shown (3 of 4)]
[im 1/71]
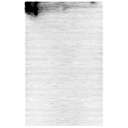

[Series 154: reformatted · coronal · 4.7mm · 6.98mm/px · 1 of 71 slices shown (4 of 4)]
[im 1/71]
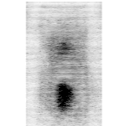

[25 of 25 positions shown; findings below may reference images not displayed]

FINDINGS: No hypermetabolic lymph nodes within the neck.

There are no hypermetabolic axillary lymph nodes.

Stable nodule within the
There is no hypermetabolic mediastinal or hilar lymph nodes.

The heart size is enlarged.

There are bilateral pleural effusions.  Airspace consolidation is
present within the right lower lobe.

No hypermetabolic pulmonary nodules or masses identified.

The spleen is normal in size.  There are no focal splenic lesions
identified.

The adrenal glands of both appear normal.

No abnormal uptake is noted within the liver parenchyma.

Gallbladder is negative.

The pancreas is negative.

Mass within the small bowel mesentery currently measures 2.3 x
cm.  There is no visible uptake within this structure.  On the
previous examination this measured 3.6 x 2.3 cm and had an SUV max
equal to 23.5.

No hypermetabolic pelvic or inguinal lymph nodes.

There is diffuse increased uptake throughout the visualized axial
and appendicular skeleton which suggests stimulated bone marrow.
IMPRESSION: 1.  There are no abnormal areas of increased FDG uptake specific
for residual or recurrent hypermetabolic tumor.
2.  Small bowel mesenteric mass has decreased in size and currently
does not exhibit malignant range FDG uptake.
3.  Diffuse marrow activity is likely due to stimulated bone
marrow.

## 2010-05-07 ENCOUNTER — Ambulatory Visit: Payer: Self-pay | Admitting: Internal Medicine

## 2010-05-08 ENCOUNTER — Ambulatory Visit (HOSPITAL_COMMUNITY): Admission: RE | Admit: 2010-05-08 | Discharge: 2010-05-08 | Payer: Self-pay | Admitting: Internal Medicine

## 2010-05-12 LAB — COMPREHENSIVE METABOLIC PANEL
Albumin: 4.5 g/dL (ref 3.5–5.2)
Alkaline Phosphatase: 119 U/L — ABNORMAL HIGH (ref 39–117)
BUN: 23 mg/dL (ref 6–23)
CO2: 27 mEq/L (ref 19–32)
Calcium: 9.5 mg/dL (ref 8.4–10.5)
Chloride: 103 mEq/L (ref 96–112)
Glucose, Bld: 101 mg/dL — ABNORMAL HIGH (ref 70–99)
Potassium: 3.8 mEq/L (ref 3.5–5.3)
Sodium: 142 mEq/L (ref 135–145)
Total Protein: 6 g/dL (ref 6.0–8.3)

## 2010-05-12 LAB — CBC WITH DIFFERENTIAL/PLATELET
Basophils Absolute: 0 10*3/uL (ref 0.0–0.1)
Eosinophils Absolute: 0.1 10*3/uL (ref 0.0–0.5)
HCT: 39.8 % (ref 34.8–46.6)
HGB: 13.1 g/dL (ref 11.6–15.9)
MONO#: 0.5 10*3/uL (ref 0.1–0.9)
NEUT#: 3.9 10*3/uL (ref 1.5–6.5)
NEUT%: 71.1 % (ref 38.4–76.8)
RDW: 15.1 % — ABNORMAL HIGH (ref 11.2–14.5)
lymph#: 0.9 10*3/uL (ref 0.9–3.3)

## 2010-05-12 LAB — LACTATE DEHYDROGENASE: LDH: 183 U/L (ref 94–250)

## 2010-06-02 ENCOUNTER — Ambulatory Visit (HOSPITAL_COMMUNITY): Admission: RE | Admit: 2010-06-02 | Discharge: 2010-06-02 | Payer: Self-pay | Admitting: Family Medicine

## 2010-06-22 IMAGING — CR DG CHEST 2V
2 series · 2 of 2 positions shown · non-contrast
Comparison: Chest 07/02/2009.

CLINICAL DATA: Fever and shortness of breath.  Lymphoma.

CHEST - 2 VIEW

[w chest lat]
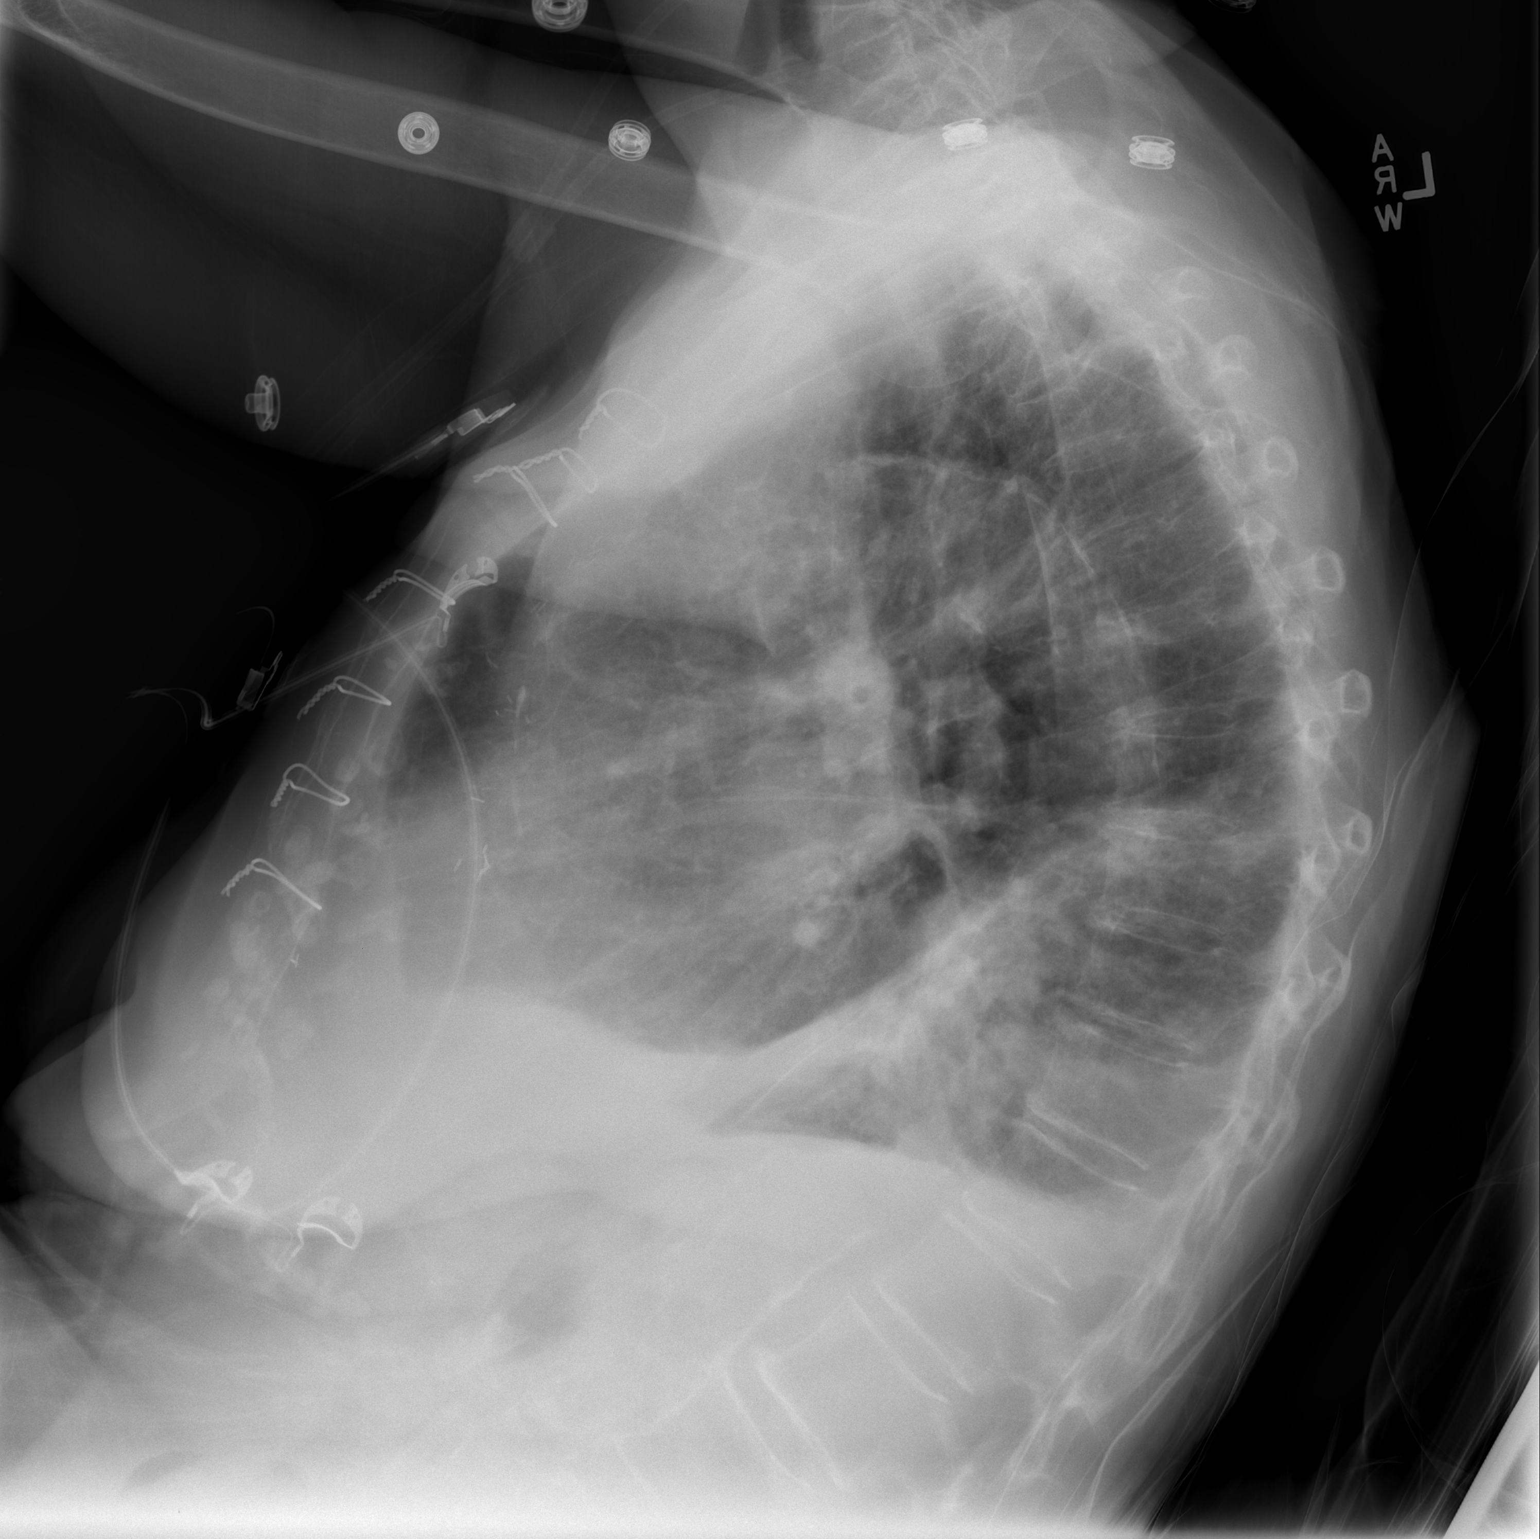

[view not recorded]
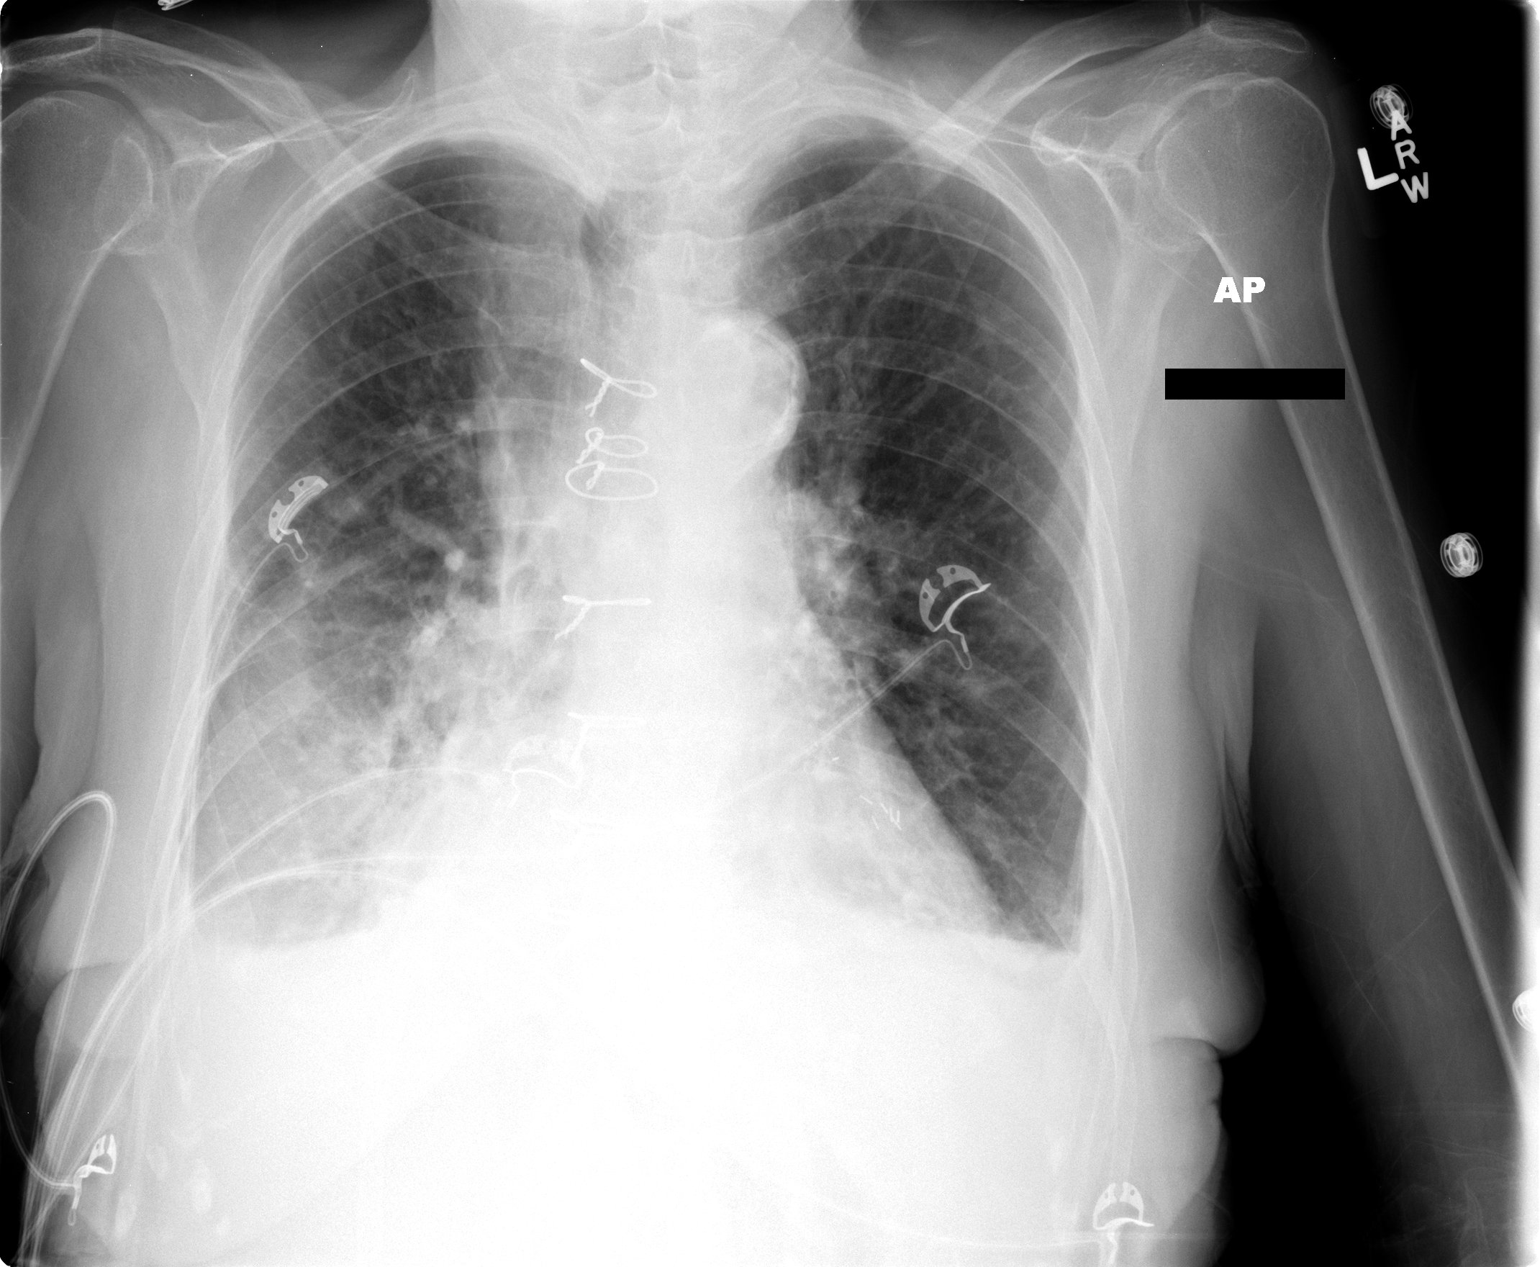

[2 of 2 positions shown; findings below may reference images not displayed]

FINDINGS: The patient has new right basilar airspace disease.
Small right effusion again noted.  Very small left effusion appears
unchanged.  Heart size is upper normal.
IMPRESSION: 1.  Right lower lobe airspace disease consistent with pneumonia.
2.  Small bilateral pleural effusions.

## 2010-07-09 ENCOUNTER — Ambulatory Visit: Payer: Self-pay | Admitting: Internal Medicine

## 2010-07-14 LAB — CBC WITH DIFFERENTIAL/PLATELET
BASO%: 0.9 % (ref 0.0–2.0)
Basophils Absolute: 0 10*3/uL (ref 0.0–0.1)
EOS%: 2.1 % (ref 0.0–7.0)
HCT: 38.9 % (ref 34.8–46.6)
HGB: 12.7 g/dL (ref 11.6–15.9)
LYMPH%: 19.7 % (ref 14.0–49.7)
MCH: 29.8 pg (ref 25.1–34.0)
MCHC: 32.6 g/dL (ref 31.5–36.0)
MONO#: 0.5 10*3/uL (ref 0.1–0.9)
NEUT%: 66.2 % (ref 38.4–76.8)
Platelets: 188 10*3/uL (ref 145–400)
lymph#: 0.8 10*3/uL — ABNORMAL LOW (ref 0.9–3.3)

## 2010-07-14 LAB — COMPREHENSIVE METABOLIC PANEL
ALT: 39 U/L — ABNORMAL HIGH (ref 0–35)
BUN: 21 mg/dL (ref 6–23)
CO2: 26 mEq/L (ref 19–32)
Calcium: 8.8 mg/dL (ref 8.4–10.5)
Chloride: 102 mEq/L (ref 96–112)
Creatinine, Ser: 0.78 mg/dL (ref 0.40–1.20)
Total Bilirubin: 0.8 mg/dL (ref 0.3–1.2)

## 2010-07-14 LAB — LACTATE DEHYDROGENASE: LDH: 164 U/L (ref 94–250)

## 2010-09-09 ENCOUNTER — Ambulatory Visit: Payer: Self-pay | Admitting: Internal Medicine

## 2010-09-11 LAB — COMPREHENSIVE METABOLIC PANEL
Albumin: 4.7 g/dL (ref 3.5–5.2)
Alkaline Phosphatase: 108 U/L (ref 39–117)
CO2: 26 mEq/L (ref 19–32)
Glucose, Bld: 83 mg/dL (ref 70–99)
Potassium: 4 mEq/L (ref 3.5–5.3)
Sodium: 141 mEq/L (ref 135–145)
Total Protein: 6.1 g/dL (ref 6.0–8.3)

## 2010-09-11 LAB — CBC WITH DIFFERENTIAL/PLATELET
BASO%: 0.7 % (ref 0.0–2.0)
EOS%: 1.9 % (ref 0.0–7.0)
LYMPH%: 17.8 % (ref 14.0–49.7)
MCHC: 33 g/dL (ref 31.5–36.0)
MONO#: 0.6 10*3/uL (ref 0.1–0.9)
MONO%: 11.1 % (ref 0.0–14.0)
Platelets: 175 10*3/uL (ref 145–400)
RBC: 4.35 10*6/uL (ref 3.70–5.45)
WBC: 5.7 10*3/uL (ref 3.9–10.3)

## 2010-09-22 ENCOUNTER — Ambulatory Visit (HOSPITAL_COMMUNITY): Admission: RE | Admit: 2010-09-22 | Discharge: 2010-09-22 | Payer: Self-pay | Admitting: Internal Medicine

## 2010-09-30 LAB — LACTATE DEHYDROGENASE: LDH: 183 U/L (ref 94–250)

## 2010-09-30 LAB — CBC WITH DIFFERENTIAL/PLATELET
BASO%: 0.2 % (ref 0.0–2.0)
MCHC: 34 g/dL (ref 31.5–36.0)
MONO#: 0.6 10*3/uL (ref 0.1–0.9)
RBC: 4.19 10*6/uL (ref 3.70–5.45)
RDW: 16.3 % — ABNORMAL HIGH (ref 11.2–14.5)
WBC: 4.2 10*3/uL (ref 3.9–10.3)
lymph#: 0.9 10*3/uL (ref 0.9–3.3)

## 2010-09-30 LAB — COMPREHENSIVE METABOLIC PANEL
ALT: 33 U/L (ref 0–35)
AST: 29 U/L (ref 0–37)
CO2: 29 mEq/L (ref 19–32)
Calcium: 9.5 mg/dL (ref 8.4–10.5)
Chloride: 105 mEq/L (ref 96–112)
Sodium: 144 mEq/L (ref 135–145)
Total Protein: 5.3 g/dL — ABNORMAL LOW (ref 6.0–8.3)

## 2010-11-20 ENCOUNTER — Inpatient Hospital Stay (HOSPITAL_COMMUNITY)
Admission: EM | Admit: 2010-11-20 | Discharge: 2010-11-25 | Payer: Self-pay | Source: Home / Self Care | Attending: Internal Medicine | Admitting: Internal Medicine

## 2010-11-23 LAB — DIFFERENTIAL
Basophils Absolute: 0 10*3/uL (ref 0.0–0.1)
Basophils Relative: 0 % (ref 0–1)
Eosinophils Absolute: 0.2 10*3/uL (ref 0.0–0.7)
Eosinophils Relative: 2 % (ref 0–5)
Lymphocytes Relative: 4 % — ABNORMAL LOW (ref 12–46)
Lymphs Abs: 0.4 10*3/uL — ABNORMAL LOW (ref 0.7–4.0)
Monocytes Absolute: 0.8 10*3/uL (ref 0.1–1.0)
Monocytes Relative: 9 % (ref 3–12)
Neutro Abs: 7 10*3/uL (ref 1.7–7.7)
Neutrophils Relative %: 84 % — ABNORMAL HIGH (ref 43–77)

## 2010-11-23 LAB — BASIC METABOLIC PANEL
BUN: 29 mg/dL — ABNORMAL HIGH (ref 6–23)
BUN: 15 mg/dL (ref 6–23)
CO2: 29 mEq/L (ref 19–32)
CO2: 28 mEq/L (ref 19–32)
Calcium: 9.1 mg/dL (ref 8.4–10.5)
Calcium: 8.8 mg/dL (ref 8.4–10.5)
Chloride: 100 mEq/L (ref 96–112)
Chloride: 103 mEq/L (ref 96–112)
Creatinine, Ser: 1.26 mg/dL — ABNORMAL HIGH (ref 0.4–1.2)
Creatinine, Ser: 0.79 mg/dL (ref 0.4–1.2)
GFR calc Af Amer: 49 mL/min — ABNORMAL LOW (ref >60)
GFR calc Af Amer: >60 mL/min (ref >60)
GFR calc non Af Amer: 41 mL/min — ABNORMAL LOW (ref >60)
GFR calc non Af Amer: >60 mL/min (ref >60)
Glucose, Bld: 120 mg/dL — ABNORMAL HIGH (ref 70–99)
Glucose, Bld: 111 mg/dL — ABNORMAL HIGH (ref 70–99)
Potassium: 3.6 mEq/L (ref 3.5–5.1)
Potassium: 3.6 mEq/L (ref 3.5–5.1)
Sodium: 139 mEq/L (ref 135–145)
Sodium: 140 mEq/L (ref 135–145)

## 2010-11-23 LAB — PROTIME-INR
INR: 1.74 — ABNORMAL HIGH (ref 0.00–1.49)
INR: 2.23 — ABNORMAL HIGH (ref 0.00–1.49)
Prothrombin Time: 20.5 seconds — ABNORMAL HIGH (ref 11.6–15.2)
Prothrombin Time: 24.8 seconds — ABNORMAL HIGH (ref 11.6–15.2)

## 2010-11-23 LAB — CBC
HCT: 41.6 % (ref 36.0–46.0)
Hemoglobin: 13.8 g/dL (ref 12.0–15.0)
MCH: 29.6 pg (ref 26.0–34.0)
MCHC: 33.2 g/dL (ref 30.0–36.0)
MCV: 89.1 fL (ref 78.0–100.0)
Platelets: 205 10*3/uL (ref 150–400)
RBC: 4.67 MIL/uL (ref 3.87–5.11)
RDW: 15.7 % — ABNORMAL HIGH (ref 11.5–15.5)
WBC: 8.3 10*3/uL (ref 4.0–10.5)

## 2010-11-23 LAB — APTT: aPTT: 42 seconds — ABNORMAL HIGH (ref 24–37)

## 2010-11-23 LAB — TSH: TSH: 2.72 u[IU]/mL (ref 0.350–4.500)

## 2010-11-25 LAB — CARDIAC PANEL(CRET KIN+CKTOT+MB+TROPI)
CK, MB: 1.1 ng/mL (ref 0.3–4.0)
Relative Index: INVALID (ref 0.0–2.5)
Total CK: 40 U/L (ref 7–177)
Troponin I: 0.02 ng/mL (ref 0.00–0.06)

## 2010-11-25 LAB — PROTIME-INR
INR: 1.98 — ABNORMAL HIGH (ref 0.00–1.49)
INR: 1.62 — ABNORMAL HIGH (ref 0.00–1.49)
Prothrombin Time: 22.7 seconds — ABNORMAL HIGH (ref 11.6–15.2)
Prothrombin Time: 19.4 seconds — ABNORMAL HIGH (ref 11.6–15.2)

## 2010-11-28 ENCOUNTER — Other Ambulatory Visit: Payer: Self-pay | Admitting: Internal Medicine

## 2010-11-28 ENCOUNTER — Encounter: Payer: Self-pay | Admitting: Family Medicine

## 2010-11-28 ENCOUNTER — Inpatient Hospital Stay (HOSPITAL_COMMUNITY)
Admission: EM | Admit: 2010-11-28 | Discharge: 2010-12-08 | Disposition: A | Discharge status: Home / Self Care | Payer: Self-pay | Attending: Internal Medicine | Admitting: Internal Medicine

## 2010-11-28 DIAGNOSIS — C8590 Non-Hodgkin lymphoma, unspecified, unspecified site: Secondary | ICD-10-CM

## 2010-11-28 NOTE — H&P (Signed)
[name], [name]              ACCOUNT NO.:  [id number]  MEDICAL RECORD NO.:  [id number]          PATIENT TYPE:  INP  LOCATION:  5038                         FACILITY:  [facility]  PHYSICIAN:  Anna Hertz, MD      DATE OF BIRTH:  05-27-1927  DATE OF ADMISSION:  11/28/2010 DATE OF DISCHARGE:                             HISTORY & PHYSICAL   PRIMARY CARE PHYSICIAN:  Anna Dull, MD  PRIMARY CARDIOLOGIST:  Anna Records, MD with [facility].  CHIEF COMPLAINT:  Planned admission for preop evaluation prior to right arm surgery for fracture.  HISTORY OF PRESENT ILLNESS:  The patient is an 77 year old woman with a history of CAD status post CABG, hypertension, hyperlipidemia, and non- Hodgkin lymphoma status post chemo, atrial fibrillation on Coumadin which has been recently held, who presented on November 21, 2010, with right arm pain status post mechanical fall.  The patient was noted to have right distal radial comminuted fracture and a right comminuted distal humerus fracture.  The patient was to undergo surgery but wanted to discuss this with her primary cardiologist, Dr. Katrinka [name], and so she was discharged with plans for followup to come back for surgery after she was able to discuss this with her primary cardiologist.  However, unfortunately, Dr. Katrinka [name] had been out of town, so the patient returns today for planned admission for surgery on monday and for preoperative evaluation while in the hospital.  The patient reports that she has had right arm pain which is well controlled with pain medications at home. She has no other specific complaints.  In terms of activity level, she reports that she does not walk up stairs, however, her exercise tolerance is maybe about a block at this time.  She denies any chest pain.  She does have occasional shortness of breath, is unable to tell if that is associated with anything in particular.  She also reports she occasionally gets lower  extremity edema which has been managed by Dr. Katrinka [name] and is currently well controlled.  REVIEW OF SYSTEMS:  Review of 10 organ systems was done and is negative except as stated above in the HPI.  ALLERGIES:  AMBIEN and MACRODANTIN.  MEDICATIONS:  Per last discharge summary include: 1. Metoprolol 25 mg twice a day. 2. Oxycodone 5 mg every 4 hours as needed for pain. 3. Diltiazem 180 mg twice daily. 4. Protonix 40 mg daily. 5. Potassium chloride 20 mEq daily. 6. Simvastatin 20 mg daily.  PAST MEDICAL HISTORY:  CAD status post CABG in 1994, mild systolic heart failure, last EF 50-55% by echo on April 12, 2009, hypertension, hyperlipidemia, non-Hodgkin lymphoma status post chemotherapy, atrial fibrillation on Coumadin, currently being held for in anticipation of surgery.  PAST SURGICAL HISTORY:  Tonsillectomy, left ankle surgery.  SOCIAL HISTORY:  The patient lives alone.  Nonsmoker.  No alcohol.  FAMILY HISTORY:  Noncontributory.  PHYSICAL EXAMINATION:  VITAL SIGNS:  97.8, 102, 20, 130/72, 99% on room air. Gen- NAD Heent - MMM, sclera anicteric CV- irreg irreg, no m Lungs- CTA B, nonlabored breathing Abd- soft, NTND Extr- trace pedal edema bilat Neuro- CN 2-12 grossly  intact Skin- no rash Psych- normal mood and affect   LABS:  White count 7.2, hemoglobin 12.6, platelets 244.  INR 1.26. Sodium 134, potassium 3.7, chloride 102, bicarb 24, BUN 8, creatinine 0.76, glucose 123, magnesium 2.3.  Echocardiogram on April 12, 2009 shows an ejection fraction of 50-55%.  ASSESSMENT AND PLAN:  The patient is an 77 year old woman with CAD status post CABG, hypertension, hyperlipidemia, atrial fibrillation previously on Coumadin, and non-Hodgkin's lymphoma s/p chemo, who initially presented with right humerus and radial fractures and presents for preop clearance prior to planned surgery next week.  1. Comminuted radial and humeral fractures right arm, status post     mechanical fall.   The patient has a planned surgery on November 30, 2010 at 11 a.m. with Dr. Orlan [name].  She has been admitted for     preoperative clearance prior to that.  Dr. Anne [name] from [facility]     Cardiology will see the patient in the morning and do preoperative     evaluation.  At this time, the patient has a low functional status,     is difficult to tell if she has greater than 4 METS of activity,     so additional testing may be required prior to surgery.  We will     defer to Cardiology.  We will control the patient's pain with     narcotics.  IV morphine has been ordered as needed or the patient     can take oxycodone or Tylenol depending on how much pain she is     having. 2. Atrial fibrillation.  At this time, the patient's Coumadin has been     held for the past week after the fall and in anticipation of the     surgery.  We will continue the patient's rate controlling     medications of metoprolol and Diltiazem.  We will defer further     management to Cardiology. 3. Coronary artery disease, hypertension, hyperlipidemia.  We will     continue the patient's home metoprolol, Diltiazem, simvastatin, and     at this time she has not been on aspirin since she was put on     Coumadin for atrial fibrillation by Dr. Katrinka [name].  We will defer     starting aspirin to Cardiology. 4. Fluid, electrolytes, and nutrition.  Put the patient on heart-     healthy diet.  Replace electrolytes as needed.  Continue her home     potassium supplementation.  We will Hep-Lock IV. 5. Prophylaxis.  We will put the patient on Lovenox for DVT     prophylaxis. 6. Disposition.  The patient will be admitted for preoperative     evaluation prior to planned surgery next week.          ______________________________ Anna Hertz, MD     JF/MEDQ  D:  11/28/2010  T:  11/28/2010  Job:  [id number]  Electronically Signed by Anna Hertz MD on 11/28/2010 07:49:31 PM

## 2010-11-29 ENCOUNTER — Encounter: Payer: Self-pay | Admitting: Family Medicine

## 2010-11-30 ENCOUNTER — Encounter: Payer: Self-pay | Admitting: Internal Medicine

## 2010-11-30 ENCOUNTER — Encounter: Payer: Self-pay | Admitting: Radiation Oncology

## 2010-11-30 LAB — PROTIME-INR: Prothrombin Time: 16.4 seconds — ABNORMAL HIGH (ref 11.6–15.2)

## 2010-12-01 LAB — LIPID PANEL
Cholesterol: 140 mg/dL (ref 0–200)
HDL: 42 mg/dL (ref >39)
LDL Cholesterol: 86 mg/dL (ref 0–99)
Total CHOL/HDL Ratio: 3.3 RATIO

## 2010-12-01 LAB — COMPREHENSIVE METABOLIC PANEL
Alkaline Phosphatase: 83 U/L (ref 39–117)
BUN: 8 mg/dL (ref 6–23)
Glucose, Bld: 123 mg/dL — ABNORMAL HIGH (ref 70–99)
Potassium: 3.7 mEq/L (ref 3.5–5.1)
Total Bilirubin: 1.4 mg/dL — ABNORMAL HIGH (ref 0.3–1.2)
Total Protein: 5.7 g/dL — ABNORMAL LOW (ref 6.0–8.3)

## 2010-12-01 LAB — BASIC METABOLIC PANEL
BUN: 12 mg/dL (ref 6–23)
Calcium: 8.5 mg/dL (ref 8.4–10.5)
Calcium: 8.5 mg/dL (ref 8.4–10.5)
Chloride: 99 mEq/L (ref 96–112)
Creatinine, Ser: 0.71 mg/dL (ref 0.4–1.2)
GFR calc Af Amer: >60 mL/min (ref >60)
GFR calc Af Amer: >60 mL/min (ref >60)
GFR calc non Af Amer: >60 mL/min (ref >60)
GFR calc non Af Amer: >60 mL/min (ref >60)
Glucose, Bld: 111 mg/dL — ABNORMAL HIGH (ref 70–99)
Potassium: 3.5 mEq/L (ref 3.5–5.1)
Potassium: 4.1 mEq/L (ref 3.5–5.1)

## 2010-12-01 LAB — URINALYSIS, ROUTINE W REFLEX MICROSCOPIC
Ketones, ur: NEGATIVE mg/dL
Leukocytes, UA: NEGATIVE
Nitrite: NEGATIVE
Protein, ur: NEGATIVE mg/dL
pH: 6 (ref 5.0–8.0)

## 2010-12-01 LAB — CBC
HCT: 38.9 % (ref 36.0–46.0)
MCH: 28.2 pg (ref 26.0–34.0)
MCHC: 32.4 g/dL (ref 30.0–36.0)
MCHC: 32.3 g/dL (ref 30.0–36.0)
Platelets: 244 10*3/uL (ref 150–400)
Platelets: 240 10*3/uL (ref 150–400)
Platelets: 250 10*3/uL (ref 150–400)
RBC: 4.14 MIL/uL (ref 3.87–5.11)
RDW: 15.7 % — ABNORMAL HIGH (ref 11.5–15.5)
WBC: 6.6 10*3/uL (ref 4.0–10.5)

## 2010-12-01 LAB — DIFFERENTIAL
Basophils Absolute: 0 10*3/uL (ref 0.0–0.1)
Basophils Absolute: 0 10*3/uL (ref 0.0–0.1)
Basophils Relative: 1 % (ref 0–1)
Basophils Relative: 1 % (ref 0–1)
Basophils Relative: 1 % (ref 0–1)
Eosinophils Absolute: 0.3 10*3/uL (ref 0.0–0.7)
Eosinophils Absolute: 0.3 10*3/uL (ref 0.0–0.7)
Eosinophils Absolute: 0.3 10*3/uL (ref 0.0–0.7)
Eosinophils Relative: 4 % (ref 0–5)
Lymphocytes Relative: 8 % — ABNORMAL LOW (ref 12–46)
Lymphs Abs: 0.3 10*3/uL — ABNORMAL LOW (ref 0.7–4.0)
Monocytes Absolute: 0.5 10*3/uL (ref 0.1–1.0)
Monocytes Absolute: 0.5 10*3/uL (ref 0.1–1.0)
Monocytes Relative: 8 % (ref 3–12)
Neutrophils Relative %: 79 % — ABNORMAL HIGH (ref 43–77)
Neutrophils Relative %: 78 % — ABNORMAL HIGH (ref 43–77)

## 2010-12-01 LAB — PROTIME-INR: INR: 1.26 (ref 0.00–1.49)

## 2010-12-01 LAB — VITAMIN B12: Vitamin B-12: 593 pg/mL (ref 211–911)

## 2010-12-01 LAB — MAGNESIUM: Magnesium: 2.3 mg/dL (ref 1.5–2.5)

## 2010-12-01 LAB — IRON AND TIBC: Saturation Ratios: 13 % — ABNORMAL LOW (ref 20–55)

## 2010-12-01 LAB — FOLATE: Folate: >20 ng/mL

## 2010-12-01 LAB — APTT: aPTT: 31 seconds (ref 24–37)

## 2010-12-01 LAB — URINE MICROSCOPIC-ADD ON

## 2010-12-01 NOTE — Consult Note (Addendum)
NAMEFLONNIE, WIERMAN NO.:  [id number]  MEDICAL RECORD NO.:  [id number]          PATIENT TYPE:  INP  LOCATION:  5038                         FACILITY:  [facility]  PHYSICIAN:  Jake Bathe, MD      DATE OF BIRTH:  June 02, 1927  DATE OF CONSULTATION:  11/29/2010 DATE OF DISCHARGE:                                CONSULTATION   CARDIOLOGIST:  Lyn Records, MD.  PRIMARY PHYSICIAN:  Duncan Dull, MD.  REASON FOR CONSULTATION:  Preoperative risk assessment prior to orthopedic surgery.  Ms. Davidson is a pleasant 77 year old female with coronary artery disease status post bypass, hypertension, hyperlipidemia, non-Hodgkin lymphoma status post chemotherapy, atrial fibrillation, on chronic anticoagulation with Coumadin, recently being held secondary to fracture who presented on November 21, 2010, with right arm pain status post mechanical fall.  She was found to have a distal right radial fracture and a right distal humerus fracture and she was about to undergo surgery, but she wanted to discuss this further with her primary cardiologist, Dr. Katrinka Blazing, so she was discharged for further evaluation by him.  She returns here from [place] for preoperative risk assessment for surgery planned tomorrow/Monday.  In regards to her current cardiac condition, she is status post bypass, LIMA to LAD in 1994, and her most recent heart catheterization took place in 2004 after an abnormal Cardiolite study showing inferolateral ischemia and a small region of anterolateral ischemia with diagonal stenosis.  During that catheterization, the plan was to continue medical therapy with aggressive blood pressure control.  She did have widely patent LIMA to LAD with segmental 80% stenosis in the proximal and mid LAD and also 70-80% stenosis in the ostium of the first diagonal. Diagonal lesion probably accounts for the small focus of anterobasal ischemia.  There is also essentially total occlusion  of the distal circumflex with the 4th obtuse marginal collateralized from left as well.  Right coronary artery contains mid mild-to-moderate disease.  Her EF was normal at 60% at that time.  She has not had a further nuclear stress test or catheterization since then.  Her last echocardiogram done on May 01, 2009, at [facility] showed low normal EF of 50-55% with moderate diffuse sclerosis without stenosis of the aortic valve, calcified mitral annular calcification with moderate regurgitation, central directed moderately to severely dilated left atrium, mild to moderate tricuspid regurgitation, and mild pulmonary artery elevation and pressure of 40 mmHg.  When questioning her about any symptoms, she does not complain of any shortness of breath, only mild with exertion, currently NYHA class I to II symptoms.  She also does not complain of any angina or chest discomfort with exertion.  No unexplained syncopal episodes.  No palpitations or racing arrhythmias per report.  She also has had no bleeding issues.  PAST MEDICAL HISTORY: 1. CAD bypass in 1994 with low normal EF 50-55% as described above. 2. Hypertension. 3. Hyperlipidemia. 4. Atrial fibrillation on chronic Coumadin, currently being held in     anticipation for surgery. 5. Non-Hodgkin lymphoma status post chemotherapy.  PAST SURGICAL HISTORY:  Tonsillectomy, left ankle surgery.  SOCIAL HISTORY:  She lives alone, was in [place].  [place].  No alcohol.  FAMILY HISTORY:  Currently noncontributory.  REVIEW OF SYSTEMS:  Other than as stated above, all other 12 review of systems negative.  ALLERGIES:  AMBIEN and MACRODANTIN.  MEDICATIONS:  She is on metoprolol 25 mg twice a day, oxycodone as needed for pain, diltiazem 180 mg twice a day, Protonix 40 mg a day, potassium 20 mEq a day, simvastatin 20 mg a day.  There is some slight discrepancy with Dr. Michaelle Copas old note which states that she was on metoprolol 50 mg  extended release twice a day, aspirin 81 mg a day, frusemide 40 mg 1-1/2 tablets every morning, Cozaar 100 mg every day, Zocor 20 mg a day, pantoprazole 40 mg a day, warfarin 2.5 mg every day except 3.75 mg Monday and Friday, and Cartia XT 180 mg extended release twice a day.  PHYSICAL EXAM:  VITAL SIGNS:  Temperature 97.4, pulse 88 systolic blood pressure 142/66, respirations 18, satting 95% on room air. GENERAL:  Very pleasant, sitting up in bed without any difficulty. EYES:  Well-perfused conjunctivae.  EOMI.  No scleral icterus. NECK:  Supple.  No lymphadenopathy.  No thyromegaly.  No carotid bruits. No JVD. CARDIOVASCULAR:  Irregularly irregular rhythm with a 2/6 systolic murmur heard best at the left upper sternal border.  Normal PMI. LUNGS:  Clear to auscultation bilaterally.  Normal respiratory effort. ABDOMEN:  Soft, nontender, normoactive bowel sounds.  No rebound or guarding. EXTREMITIES:  No significant edema identified, right arm in sling. GU:  Deferred. RECTAL:  Deferred. NEURO:  Nonfocal.  Mild resting tremor noted. PSYCH:  Normal affect, pleasant.  DATA:  Lipid profile shows an LDL of 86.  Sodium 137, potassium 3.5, BUN 10, creatinine 0.7.  CBC; white count of 6.6, hemoglobin 11.8, platelet count 240, INR currently 1.26.  Prior TSH 2.7.  Chest x-ray from January 13 shows cardiomegaly with small bilateral pleural effusions.  EKG shows atrial fibrillation, heart rate of 77.  Septal infarct pattern otherwise normal.  No change compared to prior.  ASSESSMENT/PLAN:  An 77 year old female with fractured radius and humerus, here for surgery planned tomorrow with prior history of coronary artery disease status post bypass, hypertension, hyperlipidemia, non-Hodgkin lymphoma. Preoperative risk assessment - Based upon her clinical history and no significant angina upon exertion or shortness of breath and current well- regulated atrial fibrillation without any rapid  ventricular response and also in review of her prior cardiac catheterization in 2004 showing medical management of her coronary artery disease, I do feel as though she can proceed with surgery tomorrow.  I do believe that she is at mildly increased risk from a cardiovascular perspective given her native coronary artery disease and she does understand this inherent risk. Continue to monitor for any signs of worsening atrial fibrillation, and once able, resume chronic anticoagulation with Coumadin.  In regards to her medications; currently in the hospital, she is on a lower dose of metoprolol 25 mg twice a day and diltiazem 180 mg twice a day.  Continue to monitor for any signs of rapid ventricular response.  Continue with statin. Potassium repletion as needed.  Please let us know if we can be of any further assistance in the perioperative period.  I will let Dr. Katrinka Blazing know of her arrival.     Jake Bathe, MD     MCS/MEDQ  D:  11/29/2010  T:  11/29/2010  Job:  [id number]  cc:   Ramiro Harvest, MD [facility]  Electronically Signed by Donato Schultz MD on 12/01/2010 06:34:47 AM

## 2010-12-02 LAB — BASIC METABOLIC PANEL
BUN: 9 mg/dL (ref 6–23)
CO2: 25 mEq/L (ref 19–32)
CO2: 28 mEq/L (ref 19–32)
Chloride: 104 mEq/L (ref 96–112)
Chloride: 95 mEq/L — ABNORMAL LOW (ref 96–112)
Creatinine, Ser: 0.69 mg/dL (ref 0.4–1.2)
Glucose, Bld: 153 mg/dL — ABNORMAL HIGH (ref 70–99)
Potassium: 4.2 mEq/L (ref 3.5–5.1)
Sodium: 133 mEq/L — ABNORMAL LOW (ref 135–145)

## 2010-12-02 LAB — URINE CULTURE
Colony Count: 50000
Culture  Setup Time: 201201230005

## 2010-12-02 LAB — POCT I-STAT 4, (NA,K, GLUC, HGB,HCT)
Hemoglobin: 11.2 g/dL — ABNORMAL LOW (ref 12.0–15.0)
Sodium: 134 mEq/L — ABNORMAL LOW (ref 135–145)

## 2010-12-02 LAB — DIFFERENTIAL
Basophils Absolute: 0 10*3/uL (ref 0.0–0.1)
Eosinophils Relative: 1 % (ref 0–5)
Lymphocytes Relative: 3 % — ABNORMAL LOW (ref 12–46)
Monocytes Absolute: 1 10*3/uL (ref 0.1–1.0)

## 2010-12-02 LAB — ABO/RH: ABO/RH(D): O POS

## 2010-12-02 LAB — CBC
HCT: 33.6 % — ABNORMAL LOW (ref 36.0–46.0)
MCHC: 32.1 g/dL (ref 30.0–36.0)
RDW: 15.9 % — ABNORMAL HIGH (ref 11.5–15.5)

## 2010-12-02 LAB — PROTIME-INR: INR: 1.32 (ref 0.00–1.49)

## 2010-12-02 LAB — BRAIN NATRIURETIC PEPTIDE: Pro B Natriuretic peptide (BNP): 677 pg/mL — ABNORMAL HIGH (ref 0.0–100.0)

## 2010-12-03 LAB — PROTIME-INR
INR: 2.04 — ABNORMAL HIGH (ref 0.00–1.49)
Prothrombin Time: 23.2 seconds — ABNORMAL HIGH (ref 11.6–15.2)

## 2010-12-03 LAB — CBC
HCT: 30.5 % — ABNORMAL LOW (ref 36.0–46.0)
Hemoglobin: 10 g/dL — ABNORMAL LOW (ref 12.0–15.0)
MCH: 28.7 pg (ref 26.0–34.0)
MCHC: 32.8 g/dL (ref 30.0–36.0)
RBC: 3.49 MIL/uL — ABNORMAL LOW (ref 3.87–5.11)

## 2010-12-03 LAB — BASIC METABOLIC PANEL
BUN: 8 mg/dL (ref 6–23)
Chloride: 100 mEq/L (ref 96–112)
Creatinine, Ser: 0.61 mg/dL (ref 0.4–1.2)
GFR calc Af Amer: >60 mL/min (ref >60)
GFR calc non Af Amer: >60 mL/min (ref >60)
Potassium: 4 mEq/L (ref 3.5–5.1)

## 2010-12-04 LAB — RENAL FUNCTION PANEL
BUN: 8 mg/dL (ref 6–23)
Chloride: 100 mEq/L (ref 96–112)
Creatinine, Ser: 0.56 mg/dL (ref 0.4–1.2)
Glucose, Bld: 100 mg/dL — ABNORMAL HIGH (ref 70–99)
Potassium: 3.9 mEq/L (ref 3.5–5.1)

## 2010-12-04 LAB — URINALYSIS, ROUTINE W REFLEX MICROSCOPIC
Bilirubin Urine: NEGATIVE
Specific Gravity, Urine: 1.016 (ref 1.005–1.030)
Urine Glucose, Fasting: NEGATIVE mg/dL

## 2010-12-04 LAB — URINE MICROSCOPIC-ADD ON

## 2010-12-04 LAB — PROTIME-INR: Prothrombin Time: 21.4 seconds — ABNORMAL HIGH (ref 11.6–15.2)

## 2010-12-04 NOTE — Progress Notes (Signed)
[name], [name]              ACCOUNT NO.:  [id number]  MEDICAL RECORD NO.:  [id number]          PATIENT TYPE:  INP  LOCATION:  3310                         FACILITY:  [facility]  PHYSICIAN:  Lonia Blood, M.D.       DATE OF BIRTH:  [birth year]                                PROGRESS NOTE   PRIMARY CARE PHYSICIAN: Duncan Dull, MD  CONSULTANTS THIS ADMISSION: 1. Dr. Verdis Prime with [facility] Cardiology. 2. Dr. Bradly Bienenstock IV with Orthopedics. 3. Dr. Durene Cal with Vascular Services.  CHIEF COMPLAINT/REASON FOR ADMISSION: Ms. Jester is an 77 year old female patient with multiple medical problems who presented initially on January 14 of this year with right arm pain post mechanical fall.  She was found to have a right distal radial comminuted fracture and a right comminuted distal humerus fracture.  Surgery was delayed until the patient could speak with her primary cardiologist.  She was subsequently discharged, followed up with Dr. Katrinka Blazing, and was later cleared for surgery and therefore was readmitted on the above date, November 28, 2010, for preop evaluation by the hospitalists for right arm surgery for fracture.  On initial exam, the patient was afebrile with a temperature of 97.8. Her BP was 130/72, heart rate was 102 and irregularly irregular and respirations were 20.  She was saturating 99% on room air.  Her physical exam was essentially normal except on cardiovascular exam again she was irregularly irregular.  On extremity exam she had bilateral trace pedal edema.  PAST MEDICAL HISTORY: 1. Known coronary artery disease status post coronary artery bypass     grafting surgery in 1994. 2. Known diastolic heart failure with preserved LV function EF 50-55%.     Last echo June 2010. 3. Hypertension. 4. Dyslipidemia. 5. Non-Hodgkin lymphoma post chemotherapy. 6. Atrial fibrillation, on chronic systemic anticoagulation with     Coumadin.  ADMITTING DIAGNOSES: 1. Comminuted  radial and humeral fractures of the right arm after     mechanical fall.  Plan, operative intervention November 30, 2010. 2. Atrial fibrillation with mild rapid ventricular response. 3. Systemic anticoagulation, on Coumadin currently with subtherapeutic     INR in anticipation of surgical repair in the next 24 hours. 4. Known coronary artery disease followed by Dr. Katrinka Blazing, currently     asymptomatic. 5. Hypertension, currently controlled. 6. Dyslipidemia.  DIAGNOSTIC DATA: 1. Right upper extremity arteriogram in the OR post embolectomy shows     visualized trifurcation vessels in the right calf which are now     patent. 2. A 2-D echocardiogram January 25 showed mild focal basal hypertrophy     of the septum with normal systolic function.  Estimated EF 60-65%.     Aortic valve with trivial regurgitation.  Mitral valve with mild     regurgitation in the setting of a calcified annulus.  Left and     right atrium are both moderately dilated.  Right ventricle is     mildly dilated.  Tricuspid valve with moderate regurgitation in the     setting of mild pulmonary artery hypertension measuring 48 mmHg.  LABORATORY DATA: At admission, the patient's white  count was 7200, hemoglobin 12.6, platelets 244,000, neutrophils 80%.  Hemoglobin has been decreasing slightly throughout the course of the hospitalization and as of today, hemoglobin is 10, hematocrit 30.5, and platelets are 264,000. Cholesterol 140, triglyceride 60, HDL 42, LDL 86.  Urinalysis was normal.  Anemia panel shows iron of 30, TIBC 227, percent sat 13, UIBC 197, vitamin B12 593, folate greater than 20, ferritin 247, MRSA PCR screening was negative.  Urine culture this admission showed 50,000 colonies of multiple bacterial morphotypes.  BNP on January 24 is 677. BNP January 25 is 542, and as of today, as previously noted.  Hemoglobin 10, hematocrit 30.5, white count 5700, platelets 264,000.  Sodium 35, potassium 4.0, chloride 100,  CO2 of 29, glucose 111, BUN 8, creatinine 0.61, INR 2.04.  PT 23.2.  PROCEDURES: 1. Right wrist ORIF related to displaced intra-articular radius     fracture with noted to be 4 more fragments, as well as open     treatment of right elbow supracondylar to intercondylar distal     humerus fracture requiring internal fixation, right elbow ulnar     nerve decompression and anterior transposition.  This was on     January 23 by Dr. Bradly Bienenstock. 2. Right popliteal embolectomy on December 02, 2010 by Dr. Durene Cal.  HOSPITAL COURSE.: 1. Right upper extremity comminuted fractures.  The patient had known     right distal radius as well as right distal humerus comminuted     fractures with planned admission to the hospital for surgical     repair after surgical clearance by Dr. Verdis Prime.  The patient     underwent said procedure on January 23 and from a surgical     standpoint has done well.  Currently the arm is in an immobilizing     sling.  The patient will need some formal rehabilitation after     discharge.  Consideration has been given to CIR here at [facility] but     the duration for therapy may exceed 2 weeks.  Therefore the family     will discuss amongst themselves and if anticipated they will not be     able to provide 24-hour supervision after a 2-week period     consideration will be given to skilled nurse facility for rehab at     discharge. 2. Atrial fibrillation without rapid ventricular response.  The     patient has a longstanding history of chronic AFib and has had     difficulty with rate control even at presentation with mild     tachycardia, most likely related to continued pain.  Because she     had issues with rapid AFib requiring Cardizem drip, she was     transfered to the step-down unit in the immediate postop period.  Dr. Katrinka Blazing has     been following along with [place] and has been making medication     adjustments.  She currently remains relatively rate  controlled     although still tachycardic but rates less than 120 beats per     minute.  She is on oral calcium channel blockers and oral beta-     blockers. 3. Acute right lower extremity ischemia.  Two days postop, the patient     developed ischemic symptoms in the right lower extremity.  The     patient reported she was unable to move her leg.  Urgent vascular     services consultation  was obtained.  Dr. Durene Cal evaluated     the patient.  She was immediately taken to the operating room where     she underwent a right popliteal embolectomy with full restoration     of flow and perfusion to the lower extremity.  She has no post     ischemic sequela and pulses remain present.  It is suspected that     due to the patient's longstanding history of atrial fibrillation     that she probably had an embolic event post orthopedic surgery due     to recent need to stop anticoagulation to proceed with orthopedic     surgery. 4. Mild acute hypoxemic respiratory failure, possible evolving     diastolic heart failure.  The patient has a known history of     diastolic heart failure.  Today, her neck veins were somewhat     distended.  Per Dr. Michaelle Copas note, she always has issues with     exacerbation of diastolic heart failure when she receives IV    fluids, especially if at a moderate to high rate therefore he has     decreased the IV fluid rate to keep open and is making sure her     medicines are adjusted for adequate rate control to decrease demand     ischemia that might cause her to experience diastolic heart failure     as well.  We will attempt to wean her oxygen.  She may need p.r.n.     IV Lasix. 5. Systemic anticoagulation.  At the present time, the patient is on     IV heparin and pharmacy is dosing her Coumadin.  Her INR level is     subtherapeutic. 6. Mild iron deficiency anemia.  Since admission and hydration the     patient's hemoglobin has dropped about a gram and a half  since     admission.  Some of this may be dilutional.  She did have an anemia     panel drawn which does show low iron.  Consideration should be     given to giving oral repletion of iron at time of discharge. 7. CAD.  The patient is asymptomatic without any chest pain.  She had     no apparent events related to the surgical period. 8. Hypertension.  The patient's blood pressure is actually relatively     low for her and basically well controlled. 9. Non-Hodgkin lymphoma.  This problem is stable at the present time.  DISPOSITION: At the present time the patient is appropriate to transfer to the cardiac telemetry floor for further monitoring of her atrial fibrillation and heart failure.     Allison L. Rennis Harding, N.P.   ______________________________ Lonia Blood, M.D.    ALE/MEDQ  D:  12/03/2010  T:  12/03/2010  Job:  [id number]  Electronically Signed by Junious Silk N.P. on 12/04/2010 07:35:44 AM Electronically Signed by Lonia Blood M.D. on 12/04/2010 02:14:20 PM

## 2010-12-04 NOTE — Discharge Summary (Signed)
[name], [name]              ACCOUNT NO.:  [id number]  MEDICAL RECORD NO.:  [id number]          PATIENT TYPE:  INP  LOCATION:  1519                         FACILITY:  [facility]  PHYSICIAN:  Anna Sacks, MD    DATE OF BIRTH:  1926/12/26  DATE OF ADMISSION:  11/20/2010 DATE OF DISCHARGE:                        DISCHARGE SUMMARY - REFERRING   PROJECTED DATE OF DISCHARGE:  November 25, 2010  CONDITION ON DISCHARGE:  Improved.  PRIMARY CARE PHYSICIAN:  Anna Dull, MD  PRIMARY CARDIOLOGIST:  Anna Records, MD  DISCHARGE DIAGNOSES: 1. Status post mechanical fall with elbow and wrist fractures     requiring operative repair. 2. Rate-controlled atrial fibrillation with asymptomatic pauses     generally less than 3 seconds. 3. Acute renal failure, resolved. 4. History of non-Hodgkin lymphoma.  HOSPITAL COURSE: 1. Status post mechanical fall with elbow and wrist fractures.  The     patient was seen in consultation with Dr. Melvyn [name] of Orthopedics.     He recommends open reduction and internal fixation of these     fractures.  Initially, that was tentatively planned for tomorrow,     however, he requests cardiac clearance.  The patient also would     like to discuss surgery with her primary cardiologist, Dr. Katrinka [name]     prior to proceeding.  Dr. Katrinka [name] is out of town and does not return     until January 19th.  I discussed the case with Dr. Melvyn [name] and with     the patient, she prefers to wait to discuss with Dr. Katrinka [name] and     therefore the surgery must be delayed until the following week.  As     anticipated, the patient will be discharged to skilled facility and     return for operative repair at which point she will return to [facility] for surgery and can be admitted to the hospitalist service at     that time. 2. Atrial fibrillation, which has remained rate controlled.  Please     review Dr. Michaelle [name] previous consultations.  Apparently, this has     been difficult to  control in the past, and has in the past required     3 agents.  Currently, she has been stable on 2 agents, metoprolol     and diltiazem.  She has had some asymptomatic pauses up to 2.5     seconds.  These occur early in the morning.  Per Dr. Michaelle [name] previous consultation, he did not recommend changing her rate control agents unless heart rate was staying less than 50 or sinus pauses were greater than 3 seconds.  She did have 1 sinus pause today that was greater than 3 seconds and her blood pressure has been low normal so her metoprolol has been slightly decreased.  She continues on Cardizem.  She of course is off warfarin at this time until after surgery.  Acute renal failure probably secondary to her fall and acute illness.  This has resolved.  CONSULTATIONS:  Anna Done, MD, Orthopedics.  PROCEDURES:  None.  IMAGING: 1. Wrist film on  January 13 showed comminuted impacted distal radial     fracture, suspected nondisplaced transverse fracture of the ulnar     styloid. 2. Right elbow film showed a comminuted distal humeral fracture. 3. Chest x-ray January 13th:  Passive atelectasis. 4. CT of the right elbow displays a complex fracture. 5. CT of the wrist displays an impacted and dorsally angulated intra-     articular fracture of the distal radius as well as an ulnar styloid     fracture.  MICROBIOLOGY:  None.  PERTINENT LABORATORY STUDIES: 1. TSH within normal limits. 2. Basic metabolic panel notable for a creatinine of 1.26 on     admission, on discharge 0.79.  INR 1.62 on discharge.  PHYSICAL EXAMINATION:  VITAL SIGNS:  Exam for January 17th, temperature is 97.3, pulse 70, respirations 16, blood pressure 87/68. GENERAL:  She is sitting up in chair, appears quite well. CARDIOVASCULAR:  Regular.  Normal rate, no murmur, rub, or gallop. EXTREMITIES:  No lower extremity edema. RESPIRATORY:  Clear to auscultation bilaterally.  No wheezes, rales, or rhonchi.  Normal  respiratory effort.  No lower extremity edema.  The patient feels well and has had no cardiac symptoms.  Review of telemetry demonstrates pauses up to approximately 2-2.5 seconds, these occur early in the morning.  DISCHARGE INSTRUCTIONS:  The patient will be discharged to a skilled facility.  She will continue with her right arm in a sling and splint as per Dr. Melvyn [name].  She is not to use the right arm for any weightbearing activities.  For further instructions in regard to the care of the patient's right arm, please contact Dr. Glenna [name] office.  DIET:  Heart healthy.  DISCHARGE MEDICATIONS: 1. Metoprolol 25 mg p.o. b.i.d.  Note that this has been decreased     from 50 mg p.o. b.i.d., see rationale above. 2. Oxycodone 5 mg every 4 hours as needed for pain, #20, no refills. 3. Diltiazem 180 mg p.o. b.i.d. 4. Protonix 40 mg p.o. daily. 5. Potassium chloride 20 mEq p.o. daily. 6. Simvastatin 20 mg p.o. daily.  DISCONTINUE THE FOLLOWING MEDICATIONS: 1. Xanax. 2. Lasix.  At this point, she is euvolemic and mildly hypotensive. 3. Coumadin. 4. Losartan secondary to low normal blood pressure.  This patient is tentatively scheduled for surgery with Dr. Melvyn [name] on January 23 at 11 a.m..  The place of surgery will be [place].  If she is discharged to skilled facility, she will need to be readmitted no later than January 22 on Sunday in preparation for surgery at [facility] on Monday.  Any questions or need to change the plans, please contact Dr. Melvyn [name].     Anna Sacks, MD     DG/MEDQ  D:  11/24/2010  T:  11/24/2010  Job:  [id number]  cc:   [name], M.D. Fax: [phone]  [name], M.D. Fax: [phone]  Electronically Signed by [name]  on 12/04/2010 06:05:52 PM

## 2010-12-05 LAB — GLUCOSE, CAPILLARY: Glucose-Capillary: 110 mg/dL — ABNORMAL HIGH (ref 70–99)

## 2010-12-05 LAB — PROTIME-INR
INR: 2.32 — ABNORMAL HIGH (ref 0.00–1.49)
Prothrombin Time: 25.6 seconds — ABNORMAL HIGH (ref 11.6–15.2)

## 2010-12-06 LAB — CROSSMATCH
Antibody Screen: NEGATIVE
Unit division: 0

## 2010-12-06 LAB — BASIC METABOLIC PANEL
BUN: 8 mg/dL (ref 6–23)
CO2: 28 mEq/L (ref 19–32)
Calcium: 9.1 mg/dL (ref 8.4–10.5)
Creatinine, Ser: 0.65 mg/dL (ref 0.4–1.2)
Glucose, Bld: 105 mg/dL — ABNORMAL HIGH (ref 70–99)

## 2010-12-07 LAB — BASIC METABOLIC PANEL
CO2: 30 mEq/L (ref 19–32)
Chloride: 95 mEq/L — ABNORMAL LOW (ref 96–112)
GFR calc non Af Amer: >60 mL/min (ref >60)
Glucose, Bld: 92 mg/dL (ref 70–99)
Potassium: 3.8 mEq/L (ref 3.5–5.1)
Sodium: 136 mEq/L (ref 135–145)

## 2010-12-07 LAB — PROTIME-INR: Prothrombin Time: 28.3 seconds — ABNORMAL HIGH (ref 11.6–15.2)

## 2010-12-08 LAB — BASIC METABOLIC PANEL
Calcium: 8.7 mg/dL (ref 8.4–10.5)
Creatinine, Ser: 0.7 mg/dL (ref 0.4–1.2)
GFR calc Af Amer: >60 mL/min (ref >60)

## 2010-12-20 NOTE — Consult Note (Addendum)
[name], [name]              ACCOUNT NO.:  [id number]  MEDICAL RECORD NO.:  [id number]          PATIENT TYPE:  INP  LOCATION:  3310                         FACILITY:  [facility]  PHYSICIAN:  Juleen China IV, MDDATE OF BIRTH:  [birth year]  DATE OF CONSULTATION:  12/02/2010 DATE OF DISCHARGE:                                CONSULTATION   CONSULTING PHYSICIAN:  Hospitalist Service.  REASON FOR CONSULTATION:  Cold right leg.  HISTORY:  This is an 77 year old female who was admitted to the hospital on November 28, 2010, for repair of bleeding from the right wrist and humerus fracture, which was performed on November 30, 2010.  The patient around midnight tonight began complaining of some numbness in her right foot and around 2 o'clock she developed an episode of a rapid AFib.  At that time, Doppler signals were unable to be obtained in the right leg and I was called.  I saw the patient within 15 minutes of being contacted.  She complains that she does not have any sensation in her right foot and that her mood and motor function is depressed.  She does not have any pain.  The patient has a history of coronary artery disease and has undergone CABG in the past.  She has a low normal ejection fraction of 50% to 55% and is followed by Dr. Verdis Prime.  She also suffers from hypertension, hyperlipidemia, which are medically managed.  She has non-Hodgkin lymphoma and has undergone chemotherapy.  She is on chronic anticoagulation for her atrial fibrillation with Coumadin, which has been on hold since her fracture, which occurred on November 21, 2010. This was due to a fall.  She has had SCDs for DVT prophylaxis.  She has been receiving Coumadin without heparin.Marland Kitchen  REVIEW OF SYSTEMS:  Otherwise negative except for occasional shortness of breath and what is listed above up.  She does ambulate at home, but does not walk up stairs.  PAST MEDICAL HISTORY: 1. Coronary artery disease status  post CABG in 1994. 2. Hypertension. 3. Hyperlipidemia. 4. Atrial fibrillation on chronic Coumadin. 5. Non-Hodgkin lymphoma.  PAST SURGICAL HISTORY:  Tonsillectomy, left ankle surgery, CABG, right arm surgery.  SOCIAL HISTORY:  She lives alone in [place].  She is a nonsmoker and nondrinker.  FAMILY HISTORY:  Noncontributory.  ALLERGIES:  AMBIEN and MACRODANTIN.  MEDICATIONS:  Please see medical record.  PHYSICAL EXAMINATION:  VITAL SIGNS:  Her heart rate is irregular rate in the one teens, blood pressure is 126/66, O2 sats are in the low 90s on 3 L oxygen. HEENT:  Within normal limits. RESPIRATIONS:  Nonlabored. CARDIOVASCULAR:  Irregular rate. ABDOMEN:  Soft, nontender. MUSCULOSKELETAL:  Her right arm is elevated in a cast from her recent surgery. EXTREMITIES:  Warm and well-perfused, minimal edema. SKIN:  Without rash. NEUROLOGIC:  She has no sensation in her right foot. Motor function is depressed and is normal on the left side.  The right foot is cool to the touch.  There is no mottling.  She has a Doppler signal, which is monophasic in her right popliteal artery.  She has a palpable right femoral  pulse.  She has brisk Doppler signals in the left foot.  ASSESSMENT:  Ischemic right leg.  PLAN:  Most likely, the patient suffered a thromboembolic event secondary to her atrial fibrillation having been off Coumadin for her recent operation.  Her foot is not mottled at this time, however, she has developed lack of sensation and depressed motor function, which corresponds with ischemia, especially given that she does not have Doppler signals in the foot and they were readily obtainable earlier today.  I think in an attempt for limb salvage, the patient needs to be brought to the operating room for thrombectomy.  I did discuss with the patient that this is a limb-threatening situation.  I also discussed this with her daughter-in-law via telephone.  Our plan is to  proceed emergently to the operating room for thrombectomy.     Jorge Ny, MD     VWB/MEDQ  D:  12/02/2010  T:  12/02/2010  Job:  [id number]  Electronically Signed by Arelia Longest IV MD on 12/20/2010 09:59:10 PM

## 2010-12-20 NOTE — Op Note (Addendum)
NAMEJEVAEH, SHAMS              ACCOUNT NO.:  [id number]  MEDICAL RECORD NO.:  [id number]           PATIENT TYPE:  LOCATION:                                 FACILITY:  PHYSICIAN:  Juleen China IV, MDDATE OF BIRTH:  [birth year]  DATE OF PROCEDURE:  12/02/2010 DATE OF DISCHARGE:                              OPERATIVE REPORT   PREOPERATIVE NOTE:  Ischemic right leg.  POSTOPERATIVE DIAGNOSIS:  Ischemic right leg.  PROCEDURE PERFORMED: 1. Right leg embolectomy, below-knee popliteal artery exposure. 2. Intraoperative arteriogram.  ANESTHESIA:  General.  SURGEON: 1. Durene Cal IV, MD  BLOOD LOSS:  108 mL.  SPECIMENS:  Popliteal embolus.  FINDINGS:  Severe calcific changes to the below-knee popliteal artery which was approximately 3 mm in size, small tibial vessels which appeared spasmed on angio.  INDICATIONS:  Ms. [name] is an 77 year old female on chronic Coumadin for atrial fibrillation.  She recently underwent orthopedic repair of right arm fracture.  She had been receiving Coumadin but was not therapeutic.  She developed an ischemic leg earlier this evening.  She was taken emergently to the operating room.  PROCEDURE:  The patient was identified in the holding area and taken to room 6, placed supine on the table.  General endotracheal anesthesia was administered.  The patient was prepped and draped in usual fashion. Time-out was called.  Antibiotics were given.  I initially began by making a below-knee incision.  Cautery was used to divide the subcutaneous tissue.  The saphenous vein was identified and protected and mobilized anteriorly.  The fascia was identified and opened with cautery.  A gastrocnemius muscle was reflected posteriorly.  The popliteal space was entered.  I did take down some of the soleal attachments to the tibia.  Popliteal vein was identified and mobilized. I ligated the anterior tibial vein between 3-0 silk ties.  The popliteal artery  was then exposed down to the takeoff of the anterior tibial artery.  At this point, the patient was fully heparinized.  After the heparin had circulated, we made a transverse arteriotomy with #11 blade. There was minimal bleeding upon making the arteriotomy.  The artery was heavily calcified and diseased without apparent stenosis.  I tried to advance a 4 Fogarty catheter retrograde; however, at about 15 cm I met resistance and could not get it to pass.  I then used a 3 Fogarty catheter which went across this area without difficulty.  I was able to hub a long Fogarty to perform embolectomy.  I evacuated what looked like a whitish embolus.  There was not any acute thrombus surrounding this. It was evacuated and sent off as a specimen.  Upon removing this, there was excellent pulsatile flow through the proximal popliteal artery.  I then tried to advance the 4 Fogarty catheter back; however, this would not advance.  I then repeated embolectomy with a 3 catheter which was able to be hubbed.  No thrombus was evacuated.  There was excellent pulsatile flow.  The artery was then occluded.  I then advanced a 3 Fogarty down the posterior tibial and anterior tibial artery without difficulty.  No  thrombus was evacuated.  At this point, I elected to primarily close the transverse arteriotomy.  I did this with two 6-0 Prolene sutures.  At this point, there was a palpable pulse within the popliteal artery and had excellent Doppler signal; however, unable to hear Doppler signals in the anterior tibial and posterior tibial artery and therefore elected to shoot an arteriogram.  A 25-gauge needle was placed in the popliteal artery and contrast injection was performed and x-ray was performed.  X-ray images revealed a patent posterior tibial and anterior tibial artery which crossed the ankle forming the plantar arch.  With these results, I was satisfied.  The needle was removed, held pressure until the access  site was hemostatic.  The wound was then irrigated.  I placed two interrupted sutures in the fascia.  I did not feel that I needed to perform fasciotomies at this time on the anterior and lateral compartments.  Again, I put 2 interrupted sutures in the posterior compartment fascia.  I think she is at low risk for compartment syndrome.  I did discuss the possible need for future fasciotomies with the patient in the future.  I then reapproximated the subcutaneous tissue with 3-0 Vicryl and the skin was closed with 4-0 Vicryl.  Dermabond was placed on the wound.     Jorge Ny, MD     VWB/MEDQ  D:  12/02/2010  T:  12/02/2010  Job:  [id number]  Electronically Signed by Arelia Longest IV MD on 12/20/2010 09:59:13 PM

## 2010-12-21 ENCOUNTER — Encounter (INDEPENDENT_AMBULATORY_CARE_PROVIDER_SITE_OTHER): Payer: Medicare Other

## 2010-12-21 ENCOUNTER — Ambulatory Visit (INDEPENDENT_AMBULATORY_CARE_PROVIDER_SITE_OTHER): Payer: Medicare Other | Admitting: Surgery

## 2010-12-21 ENCOUNTER — Ambulatory Visit: Payer: Self-pay | Admitting: Surgery

## 2010-12-21 DIAGNOSIS — I743 Embolism and thrombosis of arteries of the lower extremities: Secondary | ICD-10-CM

## 2010-12-21 DIAGNOSIS — Z48812 Encounter for surgical aftercare following surgery on the circulatory system: Secondary | ICD-10-CM

## 2010-12-22 NOTE — Assessment & Plan Note (Signed)
OFFICE VISIT  LYSBETH, DICOLA DOB:  11/13/1926                                       12/21/2010 [id number]#:[id number]  The patient comes back in today for followup.  I was consulted on 12/02/2010 for an ischemic right leg.  She had been in the hospital for orthopedic repair of right arm fractures.  She is on Coumadin for irregular heart rate.  She developed an ischemic leg and was taken to the operating room.  A below knee popliteal incision was performed and embolectomy and intraoperative arteriogram were done.  I did this through a transverse arteriotomy which was closed primarily.  The patient has been discharged to home.  She is back today for followup. She complains of a little bit of swelling but overall is doing very well.  Ultrasound was performed today which shows triphasic posterior tibial waveforms, the vessels were noncompressible.  Overall, the patient is doing very well.  I will plan on seeing her back in 3 months.  I am going to get a duplex to evaluate the arteriotomy site to make sure there is no evidence of stenosis.  If this is patent I will see her back on an as needed basis.  If it needs to be followed we will continue to do so per protocol.    Jorge Ny, MD Electronically Signed  VWB/MEDQ  D:  12/21/2010  T:  12/22/2010  Job:  [id number]

## 2010-12-23 NOTE — Op Note (Signed)
[name], [name]              ACCOUNT NO.:  [id number]  MEDICAL RECORD NO.:  [id number]          PATIENT TYPE:  INP  LOCATION:  3310                         FACILITY:  [facility]  PHYSICIAN:  Madelynn Done, MD  DATE OF BIRTH:  Sep 19, 1927  DATE OF PROCEDURE:  11/30/2010 DATE OF DISCHARGE:                              OPERATIVE REPORT   PREOPERATIVE DIAGNOSES: 1. Right wrist comminuted intra-articular distal radius fracture, 4     more fragments. 2. Right elbow T condylar-intercondylar distal humerus fracture.  POSTOPERATIVE DIAGNOSES: 1. Right wrist comminuted intra-articular distal radius fracture, 4     more fragments. 2. Right elbow T condylar-intercondylar distal humerus fracture.  ATTENDING PHYSICIAN:  Madelynn Done, MD who was scrubbed and present for the entire procedure.  ASSISTANT SURGEON:  Marlowe Kays, MD who was scrubbed and present for the key portion of the procedure that included the wrist and the elbow.  ANESTHESIA:  General via endotracheal tube.  TOURNIQUET TIME:  Less than 90 minutes for the wrist and 2 hours for the elbow.  SURGICAL IMPLANTS: 1. Hand innovations; volar distal radius plate, narrow plate with 6     distal locking pegs and 3 bicortical screws with 1.5 mL of Vitoss     bone graft. 2. For the right elbow, DePuy distal humerus plates, short medial     plate, and short posterolateral plate. 3. Two 4.0 cancellous cannulated screws and two 0.0625 K-wires and two     20-gauge wire for the tension band construct.  SURGICAL INDICATIONS:  Ms. Son is an 77 year old right-hand-dominant female who fell at her home, sustaining a closed injury to her right elbow and right distal humerus.  The patient was seen and evaluated and after cardiac clearance and medical evaluation, it was recommended that she proceed to the operating room for the above procedures.  Risks, benefits, and alternatives were discussed in detail with the patient  and a signed informed consent was obtained.  Risks include, but not limited to bleeding, infection; damage to nearby nerves, arteries, or tendons; loss of motion in the elbow, wrist, and digits; and need for further surgical intervention.  SURGICAL PROCEDURES: 1. Right wrist open reduction and internal fixation of displaced intra-     articular radius fracture, 4 more fragments. 2. Radiographs, 3 views, right wrist. 3. Open treatment of right elbow supracondylar-intercondylar distal     humerus fracture requiring internal fixation. 4. Right elbow ulnar nerve decompression and anterior transposition.  DESCRIPTION OF PROCEDURE:  The patient was properly identified in the preoperative holding area, a mark by permanent marker made on the right wrist to indicate correct operative site.  The patient was then brought back to the operating room, placed supine on the anesthesia room table. General anesthesia was administered.  The patient tolerated this well. A well-padded tourniquet was then placed on the right brachium and sealed with a 1000 drape.  The right upper extremity was then prepped and draped in normal sterile fashion.  Time-out was called, the correct side was identified, and the procedure was then begun.  Attention was then turned to the right wrist.  The limb was then elevated using Esmarch exsanguination and the tourniquet insufflated.  A volar approach to the distal radius was then carried out.  Dissection was then carried down through the skin and the subcutaneous tissues over the FCR.  The patient received preoperative antibiotics.  The FCR sheath was then opened.  The FPL was then identified and remainder of the pronator quadratus was then elevated.  The patient did have a large amount of comminution with intra-articular fractures, extending with 4 more fragments.  An open reduction was then carried out.  Several centimeters of Vitoss bone graft substance was then packed  into the metaphyseal deficit.  Once this, the radius was then brought out to the length.  The volar plate was then fashioned restoring the length of the ulnar column. There was a lot of comminution along the radial column.  After the ulnar column was out to length, the 3.5-mm screw was then placed proximally and the plate height was adjusted for the appropriate position.  Once this was carried out, the distal fixation was then carried out with the nonlocking pegs going from an ulnar to radial direction, a total of 6 pegs.  Placement of the distal locking pegs was then done to confirm adequate placement.  After the distal fixation was then achieved, proximal fixation was carried out with 2 more bicortical nonlocking screws.  Again, the patient did have a high degree of comminution, especially along the radial column, but I felt good fixation in getting the ulnar column back up to length.  After the 2 column fixation, the wound was then thoroughly irrigated.  The pronator quadratus was then repaired with 2-0 Vicryl.  The subcutaneous tissues closed with 4-0 Vicryl and the skin closed with 4-0 Prolene.  Adaptic dressing and sterile compressive bandage were then applied, and the patient was then the patient was kept intubated and scheduled for the second procedure. A beanbag had been already placed.  The patient already had a Foley catheter placed.  SCDs were in place during the entire procedure.  The patient was then placed in a lateral position with the arm draping over an arm board.  All pressure points were well padded.  The distal wound was then sealed with a 1000 drape and was not prepped on the right upper extremity.  The remainder of the right upper extremity was then prepped and draped in normal sterile fashion.  Time-out was called, the correct site was identified, and the procedure was then begun, the second procedure.  A curvilinear incision was made directly over the  posterior aspect of the elbow, curving radially over the olecranon tip. Dissection was then carried down through the skin and subcutaneous tissue.  Skin flaps were then raised.  Once this was carried out, the ulnar nerve was then identified medially and using a Penrose drain, the ulnar nerve was then carefully dissected free all the way up to its distal branches of the FCU.  Once this was carried out, the ulnar nerve was protected and watched throughout the entire procedure.  Following ulnar nerve release, attention became to fixing the highly comminuted intra-articular fracture.  The patient did have a large trochlear piece which was separate, this was then reduced and held in place with K-wires and reduction clamps to the lateral column.  Once this was carried out, two 4.0 cancellous screws were then placed, restoring this full.  Once the articular surface was held in place, then the fixation began piecing the articular segment back  to the diaphysis.  These were two 4.0-mm cancellous screws fully threaded with good purchase in the lateral cortices.  Once this was carried out, the patient did have a high degree of medial comminution.  The medial segment was then reduced and held in place with the plate in place and temporary K-wire fixation was then used to keep everything aligned.  The medial plate was then fashioned over the distal edge of the humerus.  The plate was then bent and contoured nicely over the distal edge of the humerus.  Following this, it was held temporarily in place with K-wires.  Plate position was then confirmed.  After confirmation of the plate position, fixation was then carried out proximally and distally with one locking screw distally and one nonlocking screw bicortical proximally.  After this position was held in place, the other screw holes were then filled with a combination of nonlocking and locking screws.  After the medial column had been fixed in place,  the posterolateral plate was then fashioned over the distal humerus.  The patient did have a lot of comminution within the distal region in the capitellum and I was unable to get purchase within the capitellum to avoid penetration into the joint.  Once the plate was then bent and then fixation was then carried out with bicortical nonlocking and locking screws, keeping the posterolateral plate in good position serving as a buttress posteriorly.  After medial column and lateral column plating, 90/90 plating, the wound was then thoroughly irrigated.  Any loose fragments within the trochlea were then removed. Copious irrigation done throughout.  Following this, the tension band construct was then carried out.  Two 0.0625 wires were then placed engaging the anterior cortex of the ulna and then a transverse drill hole was then placed through the olecranon distally and two 20-gauge wires were then used and then appropriate tension was then carried out to enclose the osteotomy site very well.  The K-wires were then cut and then bent and then tamped down the bone engaging the anterior cortex. The stainless steel wire was then cut and then bent to avoid any soft tissue penetration.  The wounds were then thoroughly irrigated. Following this, final radiographs were then obtained in 3 views.  A stress radiography did show good alignment of the radial capitellum and ulnar humeral joint.  Once this was carried out, the wounds were then irrigated.  The triceps fascia was then repaired with 0 Vicryl suture. The nerve was then transposed anteriorly and adipose sling was then carried out to protect the nerve from subluxation.  After anterior transposition of the nerve, this was repaired with 0 Vicryl suture.  The fascia was then also repaired, closing the fascial defect medially. There was good soft tissue coverage over the plate and tension band fixation.  Copious irrigation was then done.  The subcutaneous  tissues closed with 2-0 Vicryl and the skin closed with skin staples.  Adaptic dressings were applied over both wounds.  A sterile compressive bandage was then applied.  The patient was then placed in a long-arm splint. She was then extubated and taken to recovery room in good condition.  Intraoperative radiographs, 3 views of the elbow and the wrist do show the internal fixation in place.  There is good position of the radiocapitellar and ulnar humeral joints in good position of the distal radioulnar joint and radiocarpal joints.  POSTOPERATIVE PLAN:  The patient will be continued with inpatient service.  IV antibiotics for pain control  and we will continue to follow her closely.  She will be seen back in the office in approximately 10-14 days and then we will begin a very guarded therapy regimen trying to begin some active range of motion of getting at working on her extension, no flexion greater than 90 degrees, and working on some gentle forearm rotation.  Radiographs at each visit.  INDICATIONS:  Georgann Bramble arm and extending thanks for     Madelynn Done, MD     FWO/MEDQ  D:  11/30/2010  T:  12/01/2010  Job:  [id number]  Electronically Signed by Bradly Bienenstock IV MD on 12/23/2010 02:21:34 PM

## 2010-12-23 NOTE — Consult Note (Signed)
  [name], [name]              ACCOUNT NO.:  [id number]  MEDICAL RECORD NO.:  [id number]          PATIENT TYPE:  INP  LOCATION:  3310                         FACILITY:  [facility]  PHYSICIAN:  Madelynn Done, MD  DATE OF BIRTH:  Nov 13, 1926  DATE OF CONSULTATION:  11/30/2010 DATE OF DISCHARGE:                                CONSULTATION   [name] was known to me as I had seen her initially after injury.  She sustained a closed injury to right distal radius and distal humerus. The patient was brought back to [facility] for scheduled operative intervention.  I spoke with the patient, counseled the patient again about the operation.  We talked about open reduction and internal fixation and possible bone grafting of the right distal radius and open reduction and internal fixation and possible bone grafting of the right distal humerus.  We talked about the risks of surgery to include, but not limited to bleeding, infection, damage to nearby nerves, arteries, or tendons, nonunion, malunion, hardware failure, loss of motion of the elbow, wrist, and digits and need for further heterotopic bone ossification, hardware failure, and need for further surgical intervention.  All questions were answered and encouraged the patient. The patient voiced understanding.  The patient does have the intra- articular fracture of both the wrist and humerus and I think it would be amenable to do open reduction and internal fixation to restore the anatomical alignment and try to get back her elbow and wrist mobility. All questions were answered and encouraged to her today.  The patient voiced understanding and the reason for the operative intervention.  The patient was okay for surgery from a medical and cardiac standpoint.     Madelynn Done, MD     FWO/MEDQ  D:  11/30/2010  T:  12/01/2010  Job:  [id number]  Electronically Signed by Bradly Bienenstock IV MD on 12/23/2010 02:21:24 PM

## 2010-12-23 NOTE — Discharge Summary (Signed)
[name], [name]              ACCOUNT NO.:  [id number]  MEDICAL RECORD NO.:  [id number]          PATIENT TYPE:  INP  LOCATION:  2002                         FACILITY:  [facility]  PHYSICIAN:  Peggye Pitt, M.D. DATE OF BIRTH:  04/27/1927  DATE OF ADMISSION:  11/28/2010 DATE OF DISCHARGE:  12/07/2010                        DISCHARGE SUMMARY - REFERRING   DISCHARGE DIAGNOSES: 1. Comminuted radial and humeral fractures of the right arm after     mechanical fall. 2. Atrial fibrillation. 3. Ischemic right foot, status post emergent embolectomy on     12/02/2010. 4. Hypertension. 5. Hyperlipidemia. 6. Known coronary artery disease, status post coronary artery bypass     graft in 2004. 7. History of non-Hodgkin's lymphoma, status post chemotherapy. 8. Chronic diastolic congestive heart failure with ejection fraction     of 50-55% per echo in June 2012.  DISCHARGE MEDICATIONS:  Coumadin 2.5 mg tablets to take one tablet daily, but on Friday takes one and half tablets, metoprolol XL succinate 50 mg twice daily, diltiazem CD 180 mg twice daily, simvastatin 20 mg at bedtime, Lasix 60 mg daily, tramadol 50 mg to take one to two tablets every 6 hours as needed for pain, Protonix 40 mg daily, and potassium chloride 20 mEq daily.  CONSULTATION THIS HOSPITALIZATION: 1. Dr. Verdis Prime with Cardiology. 2. Dr. Bradly Bienenstock with orthopedics. 3. Dr. Durene Cal with vascular.  IMAGES AND PROCEDURES THIS HOSPITALIZATION:  Chest x-ray on 12/05/2010 that showed increasing bilateral pleural effusions with cardiomegaly.  HISTORY AND PHYSICAL:  For complete details, please refer to dictation on 11/28/2010 by Dr. Lurline Idol; but in brief, [name] is a pleasant 77- year-old Caucasian lady with past medical history as above who presented to the hospital for a planned admission for right arm surgery.  She initially presented to the hospital on 11/21/2010 after mechanical fall with right arm  pain.  She was found to have right distal radial and humeral comminuted fractures.  The patient was to undergo surgery, but wanted to discuss this with her cardiologist, Dr. Katrinka Blazing and so she was discharged with plans to follow up after she was able to discuss this with her cardiologist.  She returned to the hospital on 11/28/2010.  She proceeded to have repair of her fractures on 11/30/2010 and was doing well.  Unfortunately, on 12/02/2010, she was found to have an ischemic right foot with decreased pulses.  Vascular was emergently consulted and they emergently took her to the OR for an embolectomy.  It is presumed that this was secondary to an embolus from her atrial fibrillation given the fact that she had been off anticoagulation for some time prior to the surgery.  She postop has been doing well.  However, we have planned for a transfer to skilled nursing facility for rehabilitation purposes as the patient lives on her own and will have no help when she is discharged from the hospital.  Mild hypoxemic respiratory failure.  This likely had to do with her diastolic heart failure.  Her blood pressure and Lasix doses have been tweaked by Cardiology.  She is now on room air satting mid 90s.  All the  rest of her chronic conditions have been stable this hospitalization and she is now stable for discharge to skilled nursing facility for rehab.  VITAL SIGNS ON DAY OF DISCHARGE:  Blood pressure 124/67, heart rate 90, respirations 18, saturations are 96% on room air, and a temperature of 98.4.     Peggye Pitt, M.D.     EH/MEDQ  D:  12/07/2010  T:  12/07/2010  Job:  [id number]  cc:   Duncan Dull, M.D. Lyn Records, M.D. Madelynn Done, MD V. Charlena Cross, MD  Electronically Signed by Peggye Pitt M.D. on 12/23/2010 08:03:15 AM

## 2010-12-27 ENCOUNTER — Emergency Department (HOSPITAL_COMMUNITY)
Admission: EM | Admit: 2010-12-27 | Discharge: 2010-12-27 | Disposition: A | Discharge status: Home / Self Care | Payer: Medicare Other | Attending: Emergency Medicine | Admitting: Emergency Medicine

## 2010-12-27 DIAGNOSIS — M79609 Pain in unspecified limb: Secondary | ICD-10-CM | POA: Insufficient documentation

## 2010-12-27 DIAGNOSIS — Z79899 Other long term (current) drug therapy: Secondary | ICD-10-CM | POA: Insufficient documentation

## 2010-12-27 DIAGNOSIS — I10 Essential (primary) hypertension: Secondary | ICD-10-CM | POA: Insufficient documentation

## 2010-12-27 DIAGNOSIS — I509 Heart failure, unspecified: Secondary | ICD-10-CM | POA: Insufficient documentation

## 2010-12-27 DIAGNOSIS — Z951 Presence of aortocoronary bypass graft: Secondary | ICD-10-CM | POA: Insufficient documentation

## 2010-12-27 DIAGNOSIS — M7989 Other specified soft tissue disorders: Secondary | ICD-10-CM | POA: Insufficient documentation

## 2010-12-27 DIAGNOSIS — E785 Hyperlipidemia, unspecified: Secondary | ICD-10-CM | POA: Insufficient documentation

## 2010-12-27 DIAGNOSIS — Z7901 Long term (current) use of anticoagulants: Secondary | ICD-10-CM | POA: Insufficient documentation

## 2010-12-27 DIAGNOSIS — I4891 Unspecified atrial fibrillation: Secondary | ICD-10-CM | POA: Insufficient documentation

## 2010-12-27 DIAGNOSIS — K219 Gastro-esophageal reflux disease without esophagitis: Secondary | ICD-10-CM | POA: Insufficient documentation

## 2010-12-27 DIAGNOSIS — I2510 Atherosclerotic heart disease of native coronary artery without angina pectoris: Secondary | ICD-10-CM | POA: Insufficient documentation

## 2010-12-28 ENCOUNTER — Encounter (INDEPENDENT_AMBULATORY_CARE_PROVIDER_SITE_OTHER): Payer: Medicare Other

## 2010-12-28 DIAGNOSIS — M7989 Other specified soft tissue disorders: Secondary | ICD-10-CM

## 2010-12-28 NOTE — Assessment & Plan Note (Signed)
OFFICE VISIT  REMELL, GIAIMO DOB:  1927/07/13                                       12/28/2010 HQION#:[id number]  The patient comes back in today.  She is status post right popliteal embolectomy.  They were concerned about some swelling.  She was brought to the emergency department and sent home and told to follow up with [place] today.  Duplex was done to rule out DVT.  There is no evidence of DVT. She does have a large fluid collection in her popliteal space.  I believe this most likely represents a seroma.  I did not close her fascia in her medial incision.  I am optimistic this is going to resolve spontaneously over time.  Should it get bigger it would need to be aspirated and/or drained.  The patient will contact me if it gets bigger.  As of now I think we can continue to observe.    Jorge Ny, MD Electronically Signed  VWB/MEDQ  D:  12/28/2010  T:  12/28/2010  Job:  [id number]

## 2010-12-29 NOTE — Procedures (Unsigned)
DUPLEX DEEP VENOUS EXAM - LOWER EXTREMITY  INDICATION:  Right lower extremity swelling subsequent to right popliteal arterial embolectomy.  HISTORY:  Edema:  Yes. Trauma/Surgery:  Right popliteal embolectomy. Pain:  No. PE:  No. Previous DVT:  No. Anticoagulants: Other:  DUPLEX EXAM:               CFV   SFV   PopV  PTV    GSV               R  L  R  L  R  L  R   L  R  L Thrombosis    0     0     0     0      0 Spontaneous   +     +     +     +      + Phasic        +     +     +     +      + Augmentation  +     +     +     +      + Compressible  +     +     +     +      + Competent     +     +     +     +      +  Legend:  + - yes  o - no  p - partial  D - decreased   IMPRESSION: 1. No evidence of DVT or superficial venous thrombosis in the right     lower extremity. 2. An area of fluid in the proximal calf was observed measuring     approximately 4 cm x 12 cm in length. 3. Patent popliteal artery.        _____________________________ V. Charlena Cross, MD  LT/MEDQ  D:  12/28/2010  T:  12/28/2010  Job:  [id number]

## 2011-02-12 LAB — DIFFERENTIAL
Band Neutrophils: 0 % (ref 0–10)
Basophils Absolute: 0 10*3/uL (ref 0.0–0.1)
Basophils Absolute: 0 10*3/uL (ref 0.0–0.1)
Basophils Absolute: 0 10*3/uL (ref 0.0–0.1)
Basophils Absolute: 0 10*3/uL (ref 0.0–0.1)
Basophils Relative: 0 % (ref 0–1)
Eosinophils Absolute: 0 10*3/uL (ref 0.0–0.7)
Eosinophils Absolute: 0 10*3/uL (ref 0.0–0.7)
Eosinophils Absolute: 0 10*3/uL (ref 0.0–0.7)
Lymphocytes Relative: 0 % — ABNORMAL LOW (ref 12–46)
Lymphs Abs: 0.1 10*3/uL — ABNORMAL LOW (ref 0.7–4.0)
Lymphs Abs: 0.1 10*3/uL — ABNORMAL LOW (ref 0.7–4.0)
Lymphs Abs: 0.1 10*3/uL — ABNORMAL LOW (ref 0.7–4.0)
Monocytes Absolute: 0 10*3/uL — ABNORMAL LOW (ref 0.1–1.0)
Monocytes Absolute: 0.1 10*3/uL (ref 0.1–1.0)
Monocytes Absolute: 0.1 10*3/uL (ref 0.1–1.0)
Monocytes Absolute: 0.3 10*3/uL (ref 0.1–1.0)
Monocytes Relative: 0 % — ABNORMAL LOW (ref 3–12)
Monocytes Relative: 2 % — ABNORMAL LOW (ref 3–12)
Neutro Abs: 14 10*3/uL — ABNORMAL HIGH (ref 1.7–7.7)
Neutro Abs: 2 10*3/uL (ref 1.7–7.7)
Neutro Abs: 6.8 10*3/uL (ref 1.7–7.7)
nRBC: 0 /100 WBC

## 2011-02-12 LAB — CBC
HCT: 22.5 % — ABNORMAL LOW (ref 36.0–46.0)
HCT: 28.7 % — ABNORMAL LOW (ref 36.0–46.0)
HCT: 29.6 % — ABNORMAL LOW (ref 36.0–46.0)
HCT: 29.9 % — ABNORMAL LOW (ref 36.0–46.0)
HCT: 31.8 % — ABNORMAL LOW (ref 36.0–46.0)
HCT: 32.5 % — ABNORMAL LOW (ref 36.0–46.0)
HCT: 32.6 % — ABNORMAL LOW (ref 36.0–46.0)
Hemoglobin: 10.1 g/dL — ABNORMAL LOW (ref 12.0–15.0)
Hemoglobin: 10.1 g/dL — ABNORMAL LOW (ref 12.0–15.0)
Hemoglobin: 11.6 g/dL — ABNORMAL LOW (ref 12.0–15.0)
Hemoglobin: 9.9 g/dL — ABNORMAL LOW (ref 12.0–15.0)
MCHC: 33.8 g/dL (ref 30.0–36.0)
MCHC: 34.1 g/dL (ref 30.0–36.0)
MCHC: 34.2 g/dL (ref 30.0–36.0)
MCHC: 34.4 g/dL (ref 30.0–36.0)
MCV: 98.2 fL (ref 78.0–100.0)
MCV: 98.4 fL (ref 78.0–100.0)
MCV: 96.4 fL (ref 78.0–100.0)
MCV: 96.8 fL (ref 78.0–100.0)
MCV: 97.1 fL (ref 78.0–100.0)
MCV: 97.2 fL (ref 78.0–100.0)
MCV: 97.4 fL (ref 78.0–100.0)
Platelets: 106 10*3/uL — ABNORMAL LOW (ref 150–400)
Platelets: 88 10*3/uL — ABNORMAL LOW (ref 150–400)
Platelets: 23 10*3/uL — CL (ref 150–400)
Platelets: 31 10*3/uL — CL (ref 150–400)
Platelets: 40 10*3/uL — CL (ref 150–400)
Platelets: 51 10*3/uL — ABNORMAL LOW (ref 150–400)
Platelets: 54 10*3/uL — ABNORMAL LOW (ref 150–400)
Platelets: 68 10*3/uL — ABNORMAL LOW (ref 150–400)
RBC: 3.57 MIL/uL — ABNORMAL LOW (ref 3.87–5.11)
RBC: 2.98 MIL/uL — ABNORMAL LOW (ref 3.87–5.11)
RBC: 3.05 MIL/uL — ABNORMAL LOW (ref 3.87–5.11)
RDW: 19.6 % — ABNORMAL HIGH (ref 11.5–15.5)
RDW: 18.3 % — ABNORMAL HIGH (ref 11.5–15.5)
RDW: 18.4 % — ABNORMAL HIGH (ref 11.5–15.5)
RDW: 18.9 % — ABNORMAL HIGH (ref 11.5–15.5)
RDW: 19 % — ABNORMAL HIGH (ref 11.5–15.5)
RDW: 19.2 % — ABNORMAL HIGH (ref 11.5–15.5)
WBC: 6.5 10*3/uL (ref 4.0–10.5)
WBC: 7.1 10*3/uL (ref 4.0–10.5)
WBC: 0.5 10*3/uL — CL (ref 4.0–10.5)
WBC: 7 10*3/uL (ref 4.0–10.5)
WBC: <0.4 10*3/uL — CL (ref 4.0–10.5)

## 2011-02-12 LAB — BASIC METABOLIC PANEL
BUN: 5 mg/dL — ABNORMAL LOW (ref 6–23)
BUN: 8 mg/dL (ref 6–23)
BUN: 11 mg/dL (ref 6–23)
BUN: 6 mg/dL (ref 6–23)
BUN: 7 mg/dL (ref 6–23)
BUN: 7 mg/dL (ref 6–23)
BUN: 7 mg/dL (ref 6–23)
CO2: 28 mEq/L (ref 19–32)
CO2: 28 mEq/L (ref 19–32)
CO2: 30 mEq/L (ref 19–32)
Calcium: 8.4 mg/dL (ref 8.4–10.5)
Calcium: 8.5 mg/dL (ref 8.4–10.5)
Calcium: 8.6 mg/dL (ref 8.4–10.5)
Chloride: 96 mEq/L (ref 96–112)
Chloride: 97 mEq/L (ref 96–112)
Chloride: 98 mEq/L (ref 96–112)
Chloride: 99 mEq/L (ref 96–112)
Creatinine, Ser: 0.57 mg/dL (ref 0.4–1.2)
Creatinine, Ser: 0.57 mg/dL (ref 0.4–1.2)
Creatinine, Ser: 0.62 mg/dL (ref 0.4–1.2)
GFR calc Af Amer: >60 mL/min (ref >60)
GFR calc non Af Amer: >60 mL/min (ref >60)
GFR calc non Af Amer: >60 mL/min (ref >60)
GFR calc non Af Amer: >60 mL/min (ref >60)
GFR calc non Af Amer: >60 mL/min (ref >60)
Glucose, Bld: 92 mg/dL (ref 70–99)
Glucose, Bld: 101 mg/dL — ABNORMAL HIGH (ref 70–99)
Glucose, Bld: 122 mg/dL — ABNORMAL HIGH (ref 70–99)
Glucose, Bld: 129 mg/dL — ABNORMAL HIGH (ref 70–99)
Glucose, Bld: 146 mg/dL — ABNORMAL HIGH (ref 70–99)
Glucose, Bld: 88 mg/dL (ref 70–99)
Glucose, Bld: 95 mg/dL (ref 70–99)
Potassium: 2.7 mEq/L — CL (ref 3.5–5.1)
Potassium: 3 mEq/L — ABNORMAL LOW (ref 3.5–5.1)
Potassium: 3.2 mEq/L — ABNORMAL LOW (ref 3.5–5.1)
Potassium: 3.3 mEq/L — ABNORMAL LOW (ref 3.5–5.1)
Potassium: 3.6 mEq/L (ref 3.5–5.1)
Sodium: 134 mEq/L — ABNORMAL LOW (ref 135–145)
Sodium: 136 mEq/L (ref 135–145)
Sodium: 138 mEq/L (ref 135–145)

## 2011-02-12 LAB — URINALYSIS, MICROSCOPIC ONLY
Glucose, UA: NEGATIVE mg/dL
Ketones, ur: NEGATIVE mg/dL
Nitrite: POSITIVE — AB
Protein, ur: NEGATIVE mg/dL
pH: 5 (ref 5.0–8.0)

## 2011-02-12 LAB — COMPREHENSIVE METABOLIC PANEL
ALT: 38 U/L — ABNORMAL HIGH (ref 0–35)
AST: 27 U/L (ref 0–37)
AST: 33 U/L (ref 0–37)
Albumin: 2.6 g/dL — ABNORMAL LOW (ref 3.5–5.2)
Alkaline Phosphatase: 97 U/L (ref 39–117)
BUN: 17 mg/dL (ref 6–23)
BUN: 19 mg/dL (ref 6–23)
CO2: 28 mEq/L (ref 19–32)
CO2: 33 mEq/L — ABNORMAL HIGH (ref 19–32)
Calcium: 8.2 mg/dL — ABNORMAL LOW (ref 8.4–10.5)
Calcium: 8.6 mg/dL (ref 8.4–10.5)
Chloride: 102 mEq/L (ref 96–112)
Chloride: 93 mEq/L — ABNORMAL LOW (ref 96–112)
Creatinine, Ser: 0.67 mg/dL (ref 0.4–1.2)
Creatinine, Ser: 0.72 mg/dL (ref 0.4–1.2)
GFR calc Af Amer: >60 mL/min (ref >60)
GFR calc non Af Amer: >60 mL/min (ref >60)
Glucose, Bld: 128 mg/dL — ABNORMAL HIGH (ref 70–99)
Glucose, Bld: 145 mg/dL — ABNORMAL HIGH (ref 70–99)
Potassium: 3.3 mEq/L — ABNORMAL LOW (ref 3.5–5.1)
Sodium: 136 mEq/L (ref 135–145)
Total Bilirubin: 1.7 mg/dL — ABNORMAL HIGH (ref 0.3–1.2)
Total Bilirubin: 2.5 mg/dL — ABNORMAL HIGH (ref 0.3–1.2)
Total Protein: 4.7 g/dL — ABNORMAL LOW (ref 6.0–8.3)
Total Protein: 5.7 g/dL — ABNORMAL LOW (ref 6.0–8.3)

## 2011-02-12 LAB — PHOSPHORUS: Phosphorus: 3.2 mg/dL (ref 2.3–4.6)

## 2011-02-12 LAB — CROSSMATCH: ABO/RH(D): O POS

## 2011-02-12 LAB — MAGNESIUM
Magnesium: 1.6 mg/dL (ref 1.5–2.5)
Magnesium: 1.7 mg/dL (ref 1.5–2.5)

## 2011-02-12 LAB — URINE CULTURE: Colony Count: >=100000

## 2011-02-12 LAB — CULTURE, BLOOD (ROUTINE X 2)

## 2011-02-12 LAB — CULTURE, BLOOD (SINGLE)

## 2011-02-12 LAB — BRAIN NATRIURETIC PEPTIDE
Pro B Natriuretic peptide (BNP): 840 pg/mL — ABNORMAL HIGH (ref 0.0–100.0)
Pro B Natriuretic peptide (BNP): 981 pg/mL — ABNORMAL HIGH (ref 0.0–100.0)

## 2011-02-12 LAB — GLUCOSE, CAPILLARY: Glucose-Capillary: 122 mg/dL — ABNORMAL HIGH (ref 70–99)

## 2011-02-13 LAB — CROSSMATCH

## 2011-02-14 LAB — DIFFERENTIAL
Basophils Absolute: 0 10*3/uL (ref 0.0–0.1)
Basophils Relative: 0 % (ref 0–1)
Basophils Relative: 2 % — ABNORMAL HIGH (ref 0–1)
Eosinophils Absolute: 0.1 10*3/uL (ref 0.0–0.7)
Eosinophils Relative: 1 % (ref 0–5)
Lymphocytes Relative: 2 % — ABNORMAL LOW (ref 12–46)
Lymphs Abs: 0.1 10*3/uL — ABNORMAL LOW (ref 0.7–4.0)
Monocytes Absolute: 0.1 10*3/uL (ref 0.1–1.0)
Monocytes Relative: 1 % — ABNORMAL LOW (ref 3–12)
Monocytes Relative: 2 % — ABNORMAL LOW (ref 3–12)
Neutro Abs: 6.4 10*3/uL (ref 1.7–7.7)
Neutrophils Relative %: 95 % — ABNORMAL HIGH (ref 43–77)
Neutrophils Relative %: 95 % — ABNORMAL HIGH (ref 43–77)

## 2011-02-14 LAB — CBC
HCT: 27.8 % — ABNORMAL LOW (ref 36.0–46.0)
HCT: 30.2 % — ABNORMAL LOW (ref 36.0–46.0)
Hemoglobin: 9.4 g/dL — ABNORMAL LOW (ref 12.0–15.0)
MCV: 91.2 fL (ref 78.0–100.0)
MCV: 91.7 fL (ref 78.0–100.0)
Platelets: 96 10*3/uL — ABNORMAL LOW (ref 150–400)
RBC: 3.05 MIL/uL — ABNORMAL LOW (ref 3.87–5.11)
RBC: 3.29 MIL/uL — ABNORMAL LOW (ref 3.87–5.11)
WBC: 6.7 10*3/uL (ref 4.0–10.5)
WBC: 7.8 10*3/uL (ref 4.0–10.5)

## 2011-02-14 LAB — CK TOTAL AND CKMB (NOT AT ARMC)
CK, MB: 0.5 ng/mL (ref 0.3–4.0)
Total CK: 10 U/L (ref 7–177)

## 2011-02-14 LAB — BASIC METABOLIC PANEL
Chloride: 96 mEq/L (ref 96–112)
Chloride: 98 mEq/L (ref 96–112)
GFR calc Af Amer: 44 mL/min — ABNORMAL LOW (ref >60)
GFR calc Af Amer: 59 mL/min — ABNORMAL LOW (ref >60)
GFR calc non Af Amer: 36 mL/min — ABNORMAL LOW (ref >60)
GFR calc non Af Amer: 49 mL/min — ABNORMAL LOW (ref >60)
Potassium: 3.5 mEq/L (ref 3.5–5.1)
Potassium: 3.7 mEq/L (ref 3.5–5.1)
Sodium: 136 mEq/L (ref 135–145)
Sodium: 137 mEq/L (ref 135–145)

## 2011-02-14 LAB — HEPATIC FUNCTION PANEL
AST: 17 U/L (ref 0–37)
Bilirubin, Direct: 0.2 mg/dL (ref 0.0–0.3)
Indirect Bilirubin: 0.5 mg/dL (ref 0.3–0.9)
Total Bilirubin: 0.7 mg/dL (ref 0.3–1.2)

## 2011-02-14 LAB — URINALYSIS, ROUTINE W REFLEX MICROSCOPIC
Glucose, UA: NEGATIVE mg/dL
Ketones, ur: NEGATIVE mg/dL
Nitrite: NEGATIVE
Specific Gravity, Urine: 1.017 (ref 1.005–1.030)
pH: 5.5 (ref 5.0–8.0)

## 2011-02-14 LAB — DIGOXIN LEVEL: Digoxin Level: 0.3 ng/mL — ABNORMAL LOW (ref 0.8–2.0)

## 2011-02-14 LAB — TROPONIN I: Troponin I: 0.03 ng/mL (ref 0.00–0.06)

## 2011-02-14 LAB — CARDIAC PANEL(CRET KIN+CKTOT+MB+TROPI)
CK, MB: 0.5 ng/mL (ref 0.3–4.0)
Relative Index: INVALID (ref 0.0–2.5)
Relative Index: INVALID (ref 0.0–2.5)
Total CK: 11 U/L (ref 7–177)
Total CK: 9 U/L (ref 7–177)

## 2011-02-14 LAB — LIPID PANEL
LDL Cholesterol: 90 mg/dL (ref 0–99)
Total CHOL/HDL Ratio: 4.9 RATIO
VLDL: 24 mg/dL (ref 0–40)

## 2011-02-14 LAB — PROTIME-INR
INR: 1.1 (ref 0.00–1.49)
Prothrombin Time: 14.6 seconds (ref 11.6–15.2)

## 2011-02-15 LAB — CBC
HCT: 25.9 % — ABNORMAL LOW (ref 36.0–46.0)
HCT: 27.6 % — ABNORMAL LOW (ref 36.0–46.0)
HCT: 28.5 % — ABNORMAL LOW (ref 36.0–46.0)
HCT: 34.6 % — ABNORMAL LOW (ref 36.0–46.0)
Hemoglobin: 11.6 g/dL — ABNORMAL LOW (ref 12.0–15.0)
Hemoglobin: 8.7 g/dL — ABNORMAL LOW (ref 12.0–15.0)
Hemoglobin: 9.9 g/dL — ABNORMAL LOW (ref 12.0–15.0)
MCHC: 33.6 g/dL (ref 30.0–36.0)
MCHC: 33.7 g/dL (ref 30.0–36.0)
MCHC: 34.2 g/dL (ref 30.0–36.0)
MCHC: 34.5 g/dL (ref 30.0–36.0)
MCV: 83.9 fL (ref 78.0–100.0)
MCV: 85 fL (ref 78.0–100.0)
MCV: 87.2 fL (ref 78.0–100.0)
MCV: 87.8 fL (ref 78.0–100.0)
Platelets: 33 10*3/uL — CL (ref 150–400)
Platelets: 82 10*3/uL — ABNORMAL LOW (ref 150–400)
RBC: 3.4 MIL/uL — ABNORMAL LOW (ref 3.87–5.11)
RBC: 3.97 MIL/uL (ref 3.87–5.11)
RBC: 3.99 MIL/uL (ref 3.87–5.11)
RBC: 4.1 MIL/uL (ref 3.87–5.11)
RBC: 4.24 MIL/uL (ref 3.87–5.11)
RDW: 21.8 % — ABNORMAL HIGH (ref 11.5–15.5)
WBC: 0.5 10*3/uL — CL (ref 4.0–10.5)
WBC: 12.1 10*3/uL — ABNORMAL HIGH (ref 4.0–10.5)
WBC: 4.4 10*3/uL (ref 4.0–10.5)
WBC: 9.8 10*3/uL (ref 4.0–10.5)
WBC: 9.8 10*3/uL (ref 4.0–10.5)
WBC: <0.4 10*3/uL — CL (ref 4.0–10.5)

## 2011-02-15 LAB — DIFFERENTIAL
Band Neutrophils: 0 % (ref 0–10)
Basophils Absolute: 0 10*3/uL (ref 0.0–0.1)
Basophils Relative: 0 % (ref 0–1)
Eosinophils Absolute: 0 10*3/uL (ref 0.0–0.7)
Eosinophils Relative: 0 % (ref 0–5)
Lymphocytes Relative: 0 % — ABNORMAL LOW (ref 12–46)
Lymphs Abs: 0 10*3/uL — ABNORMAL LOW (ref 0.7–4.0)
Lymphs Abs: 0 10*3/uL — ABNORMAL LOW (ref 0.7–4.0)
Lymphs Abs: 0.2 10*3/uL — ABNORMAL LOW (ref 0.7–4.0)
Monocytes Absolute: 0 10*3/uL — ABNORMAL LOW (ref 0.1–1.0)
Monocytes Absolute: 0.2 10*3/uL (ref 0.1–1.0)
Monocytes Relative: 0 % — ABNORMAL LOW (ref 3–12)
Neutro Abs: 0 10*3/uL — ABNORMAL LOW (ref 1.7–7.7)
Neutro Abs: 9.4 10*3/uL — ABNORMAL HIGH (ref 1.7–7.7)
nRBC: 0 /100 WBC

## 2011-02-15 LAB — MAGNESIUM: Magnesium: 1.3 mg/dL — ABNORMAL LOW (ref 1.5–2.5)

## 2011-02-15 LAB — COMPREHENSIVE METABOLIC PANEL
ALT: 16 U/L (ref 0–35)
ALT: 21 U/L (ref 0–35)
Albumin: 2.5 g/dL — ABNORMAL LOW (ref 3.5–5.2)
Albumin: 2.8 g/dL — ABNORMAL LOW (ref 3.5–5.2)
Alkaline Phosphatase: 68 U/L (ref 39–117)
Alkaline Phosphatase: 74 U/L (ref 39–117)
Alkaline Phosphatase: 85 U/L (ref 39–117)
BUN: 20 mg/dL (ref 6–23)
BUN: 5 mg/dL — ABNORMAL LOW (ref 6–23)
BUN: 8 mg/dL (ref 6–23)
CO2: 27 mEq/L (ref 19–32)
CO2: 30 mEq/L (ref 19–32)
Chloride: 103 mEq/L (ref 96–112)
Chloride: 105 mEq/L (ref 96–112)
Chloride: 98 mEq/L (ref 96–112)
Chloride: 98 mEq/L (ref 96–112)
Creatinine, Ser: 0.85 mg/dL (ref 0.4–1.2)
GFR calc non Af Amer: >60 mL/min (ref >60)
GFR calc non Af Amer: >60 mL/min (ref >60)
Glucose, Bld: 108 mg/dL — ABNORMAL HIGH (ref 70–99)
Glucose, Bld: 123 mg/dL — ABNORMAL HIGH (ref 70–99)
Glucose, Bld: 85 mg/dL (ref 70–99)
Potassium: 2.8 mEq/L — ABNORMAL LOW (ref 3.5–5.1)
Potassium: 3.2 mEq/L — ABNORMAL LOW (ref 3.5–5.1)
Potassium: 3.4 mEq/L — ABNORMAL LOW (ref 3.5–5.1)
Sodium: 136 mEq/L (ref 135–145)
Sodium: 139 mEq/L (ref 135–145)
Total Bilirubin: 1 mg/dL (ref 0.3–1.2)
Total Bilirubin: 2.1 mg/dL — ABNORMAL HIGH (ref 0.3–1.2)
Total Bilirubin: 2.3 mg/dL — ABNORMAL HIGH (ref 0.3–1.2)
Total Bilirubin: 2.3 mg/dL — ABNORMAL HIGH (ref 0.3–1.2)
Total Protein: 4.7 g/dL — ABNORMAL LOW (ref 6.0–8.3)

## 2011-02-15 LAB — CROSSMATCH
ABO/RH(D): O POS
Antibody Screen: NEGATIVE

## 2011-02-15 LAB — BRAIN NATRIURETIC PEPTIDE
Pro B Natriuretic peptide (BNP): 1062 pg/mL — ABNORMAL HIGH (ref 0.0–100.0)
Pro B Natriuretic peptide (BNP): 1212 pg/mL — ABNORMAL HIGH (ref 0.0–100.0)
Pro B Natriuretic peptide (BNP): 989 pg/mL — ABNORMAL HIGH (ref 0.0–100.0)

## 2011-02-15 LAB — BASIC METABOLIC PANEL
BUN: 6 mg/dL (ref 6–23)
CO2: 26 mEq/L (ref 19–32)
CO2: 31 mEq/L (ref 19–32)
Calcium: 8.3 mg/dL — ABNORMAL LOW (ref 8.4–10.5)
Calcium: 8.4 mg/dL (ref 8.4–10.5)
Chloride: 102 mEq/L (ref 96–112)
Chloride: 104 mEq/L (ref 96–112)
Creatinine, Ser: 0.74 mg/dL (ref 0.4–1.2)
Creatinine, Ser: 0.79 mg/dL (ref 0.4–1.2)
GFR calc Af Amer: >60 mL/min (ref >60)
GFR calc Af Amer: >60 mL/min (ref >60)
GFR calc non Af Amer: >60 mL/min (ref >60)
Glucose, Bld: 119 mg/dL — ABNORMAL HIGH (ref 70–99)
Potassium: 3.5 mEq/L (ref 3.5–5.1)
Sodium: 136 mEq/L (ref 135–145)

## 2011-02-15 LAB — ABO/RH: ABO/RH(D): O POS

## 2011-02-16 LAB — CSF CELL COUNT WITH DIFFERENTIAL: WBC, CSF: 2 /mm3 (ref 0–5)

## 2011-02-16 LAB — PROTEIN AND GLUCOSE, CSF
Glucose, CSF: 51 mg/dL (ref 43–76)
Total  Protein, CSF: 59 mg/dL — ABNORMAL HIGH (ref 15–45)

## 2011-02-16 LAB — CSF CULTURE W GRAM STAIN: Gram Stain: NONE SEEN

## 2011-02-16 LAB — PATHOLOGIST SMEAR REVIEW

## 2011-02-17 LAB — CULTURE, ROUTINE-ABSCESS: Culture: NO GROWTH

## 2011-02-17 LAB — BASIC METABOLIC PANEL
BUN: 15 mg/dL (ref 6–23)
CO2: 32 mEq/L (ref 19–32)
Calcium: 9.9 mg/dL (ref 8.4–10.5)
Creatinine, Ser: 0.78 mg/dL (ref 0.4–1.2)
Glucose, Bld: 114 mg/dL — ABNORMAL HIGH (ref 70–99)

## 2011-02-17 LAB — GRAM STAIN

## 2011-02-17 LAB — ANAEROBIC CULTURE

## 2011-02-17 LAB — CBC
MCHC: 33.3 g/dL (ref 30.0–36.0)
RDW: 16.3 % — ABNORMAL HIGH (ref 11.5–15.5)

## 2011-03-22 ENCOUNTER — Ambulatory Visit (INDEPENDENT_AMBULATORY_CARE_PROVIDER_SITE_OTHER): Payer: Medicare Other | Admitting: Surgery

## 2011-03-22 ENCOUNTER — Encounter (INDEPENDENT_AMBULATORY_CARE_PROVIDER_SITE_OTHER): Payer: Medicare Other

## 2011-03-22 DIAGNOSIS — I739 Peripheral vascular disease, unspecified: Secondary | ICD-10-CM

## 2011-03-22 DIAGNOSIS — Z48812 Encounter for surgical aftercare following surgery on the circulatory system: Secondary | ICD-10-CM

## 2011-03-22 DIAGNOSIS — I743 Embolism and thrombosis of arteries of the lower extremities: Secondary | ICD-10-CM

## 2011-03-23 NOTE — H&P (Signed)
NAMEJONAE, [name]              ACCOUNT NO.:  [id number]   MEDICAL RECORD NO.:  [id number]          PATIENT TYPE:  INP   LOCATION:  0101                         FACILITY:  [facility]   PHYSICIAN:  Lucile Crater, MD         DATE OF BIRTH:  02-Nov-1927   DATE OF ADMISSION:  06/02/2009  DATE OF DISCHARGE:                              HISTORY & PHYSICAL   PRIMARY CARE PHYSICIAN:  Duncan Dull, M.D.   PRIMARY ONCOLOGIST:  Lajuana Matte, MD   PRIMARY CARDIOLOGIST:  Lyn Records, M.D.   CHIEF COMPLAINT:  Altered mental status, fever.   HISTORY OF PRESENT ILLNESS:  Ms. [name] is a 77 year old pleasant female  with recently diagnosed non-Hodgkin's lymphoma.  She is on chemotherapy  being followed by Dr. Arbutus Ped.  The patient got CHOP regimen with  Rituxan.  She was recently discharged from the hospital on May 16, 2009,  after she was admitted with neutropenia.  Today, her neutrophil counts  are within normal limits.  The patient's daughter was concerned because  Ms. [name] was not acting normal, and she was saying did not make since.  The words were clear, but no dysarthria, but responses were not  appropriate.  She also noted that her mother had a low-grade fever of  75.  Because of the recent admission for neutropenia, she was concerned  about the fever and called the primary oncologist who recommended that  the patient be taken to the emergency room for evaluation.  In the ER,  the patient's daughter refused to have a CT scan of the head, wanting  Dr. Arbutus Ped to see the patient tomorrow and order the CT scan.  After  explaining to the patient that the altered mental status could be  secondary to a bleed and doing the CT scan is important, she agreed to  it.  Upon further questioning, there are no other complaints.  There is  no cough.  There are no chills.  There is no bloody micturition.  There  is no headache.  There are no seizures witnessed.  There is no motor  deficit.  There is  no history of fall or trauma.  The preliminary labs  in the ER did not reveal any source of infection.  Her white count was  within normal limits.  Her urinalysis did not reveal any urinary tract  infection.  There was no evidence of pneumonia on the CT scan.   REVIEW OF SYSTEMS:  A complete review of systems was done which included  General, Head, Eyes, Ears, Nose, Throat, Cardiovascular, Respiratory,  GI/GU, Endocrine, Musculoskeletal, Neurological, Psychiatric all within  normal limits other than as mentioned above.   PAST MEDICAL HISTORY:  1. Stage IV non-Hodgkin's lymphoma.  2. Atrial fibrillation.  3. Neutropenia secondary to chemotherapy.  4. Anemia.  5. Hypertension.  6. Thrombocytopenia secondary to chemotherapy.  7. Obstructive coronary artery disease status post CABG.   ALLERGIES:  NITROFURANTOIN.   CURRENT MEDICATIONS AT HOME:  1. Diltiazem 40 mg p.o. daily.  2. Metoprolol 100 mg p.o. b.i.d.  3. Cozaar 100  mg p.o. daily.  4. Zocor 20 mg p.o. daily.  5. Allopurinol 100 mg p.o. t.i.d.  6. Aspirin 325 mg p.o. daily.  7. Digoxin 0.25 mg p.o. daily.  8. Lasix 40 mg p.o. daily.  9. Xanax 0.25 mg p.o. q.8 h p.r.n. anxiety.  10.Senokot two tablets p.o. b.i.d. p.r.n. constipation.  11.Compazine 5 mg p.o. q.6 h p.r.n. nausea.  12.Protonix 40 mg p.o. daily.   SOCIAL HISTORY:  There is no history of tobacco, alcohol.  She is  widowed.   FAMILY HISTORY:  Coronary artery disease.  There is no history of breast  cancer in her mother.  Her sister had colon cancer.   PHYSICAL EXAMINATION:  VITAL SIGNS:  T-max 98.8, blood pressure 124/76,  pulse rate 82-103, respiratory rate 20, O2 saturation 100% on room air.  GENERAL:  Cachectic, very weak, not in any acute distress.  Alert,  awake, oriented to [name], daughter.  Is not oriented to place and  time.  HEENT:  Normocephalic, atraumatic.  NECK:  Supple.  No JVD, lymphadenopathy.  CV:  Irregularly irregular rhythm.   Tachycardiac.  LUNGS:  Clear.  ABDOMEN:  Soft, nondistended, nontender.  EXTREMITIES:  No cyanosis, clubbing or edema.  NEUROLOGICAL:  Oriented to person and hospital, otherwise, nonfocal.   LABORATORY STUDIES:  CBC with differential, WBC 7800, hemoglobin 9.8,  hematocrit 30, platelets 110,000.  Neutrophils 95,000, absolute  neutrophil count 7300.  Sodium 137, potassium 3.7, chloride 96,  bicarbonate 32, BUN 28, creatinine 1.4.  Blood glucose 118.  Urinalysis  within normal limits.  Chest x-ray negative for air space disease.   ASSESSMENT/PLAN:  1. Altered mental status, multiple possibilities exist.  The patient      has a history of non-Hodgkin's lymphoma.  Will do a CT scan of the      head to evaluate for any acute bleed.  Metastases are also a      possibility.  She was recently diagnosed with atrial fibrillation      with rapid ventricular response.  She is not on anticoagulation      given multiple comorbidities.  She could have had a stroke.  Will      evaluate for these possibilities.  First, will wait for the CT scan      to be done.  Will consider MRI if the CT scan is negative.  She      also has acute renal failure.  Metabolic reasons are a possibility.      She does not have any infection which could explain the altered      mental status.  2. Non-Hodgkin's lymphoma stage IV.  Will consult Dr. Arbutus Ped.  She      has received chemotherapy with CHOP-R.  3. Neutropenia, resolved.  4. Thrombocytopenia, stable.  5. Obstructive coronary artery disease.  The patient does not have any      chest pain.  We will cycle cardiac enzymes.  Will continue aspirin.  6. Acute renal failure, likely secondary to hypovolemia.  Will hold      off on the ACE inhibitor and Lasix.  Will also stop allopurinol.      Will gently hydrate with fluids.  Will repeat a BMP in the morning.  7. Questionable digitalis effect on the EKG.  We will hold digoxin.      Will check a digoxin level.  8. Acute  renal failure.  Will closely monitor.  9. Hypertension.  Well controlled.  Will continue all the  medications      except the ARB and Lasix.  10.GERD.  Will continue Protonix.  11.Hyperlipidemia.  Will continue Simvastatin.  12.Deep vein thrombosis prophylaxis with STDs.  13.Fluids, Electrolytes and Nutrition.  Will gently hydrate.  Will      replace her electrolytes as needed.  She will be started on an AHA      diet.      Lucile Crater, MD  Electronically Signed     TA/MEDQ  D:  06/02/2009  T:  06/02/2009  Job:  [id number]

## 2011-03-23 NOTE — Assessment & Plan Note (Signed)
OFFICE VISIT  [name], [name] DOB:  1927-01-23                                       03/22/2011 ZOXWR#:[id number]  Patient comes back today for follow-up.  She underwent a right popliteal embolectomy on 12/02/2010 for an ischemic right foot.  Postoperative course for this was uncomplicated.  She has been discharged home.  She is back today for follow-up.  Her biggest complaint is swelling in both legs, right greater than left,  PHYSICAL EXAMINATION:  Heart rate is 60, blood pressure 108/70, temperature is 98.4.  General:  She is well-appearing, no distress. Respirations are nonlabored.  Cardiovascular:  Pedal pulse on the right is not palpable secondary to 2+ edema.  There are no ulcerations.  There is a brawny discoloration to the right ankle.  DIAGNOSTIC STUDIES:  Ultrasound was performed today which again identifies the posterior fluid collection, which is slightly decreased. No significant velocity elevations in her leg.  ASSESSMENT:  Status post right popliteal embolectomy.  PLAN:  The patient is doing very well at this time.  I think that her edema is multifactorial since she does have bilateral swelling. There most likely is a cardiogenic component on the right leg; however, I think it is likely postsurgical.  In either event, I think she would benefit from compression stockings.  I am giving her a prescription for 20-30 mL knee-high compression stockings.  She is scheduled to see Dr. Katrinka Blazing within the next month.  I will plan on continuing to follow her with ultrasound surveillance. She will come back to see the [facility] clinic in 1 year.    Jorge Ny, MD Electronically Signed  VWB/MEDQ  D:  03/22/2011  T:  03/23/2011  Job:  [id number]  cc:   Lyn Records, M.D.

## 2011-03-23 NOTE — Op Note (Signed)
[name], [name]              ACCOUNT NO.:  [id number]   MEDICAL RECORD NO.:  [id number]          PATIENT TYPE:  OIB   LOCATION:  3535                         FACILITY:  [facility]   PHYSICIAN:  Stefani Dama, M.D.  DATE OF BIRTH:  12-May-1927   DATE OF PROCEDURE:  02/25/2009  DATE OF DISCHARGE:                               OPERATIVE REPORT   PREOPERATIVE DIAGNOSIS:  Scalp lesion on vertex of scalp.   POSTOPERATIVE DIAGNOSIS:  Scalp lesion on vertex of scalp.   FROZEN SECTION DIAGNOSIS:  Probable lymphoma.   SURGEON:  Stefani Dama, MD   ANESTHESIA:  General.   INDICATIONS:  [name] is a 77 year old individual who has had a  progressively enlarging mass on the vertex of her scalp in the occipital  region.  She tells me that about 3 weeks ago she had a fall.  Few days  later, she noted a small knot on the back of her head.  However, she  admits that when she fell she struck her head in the frontal region.  The mass has gotten progressively larger.  It has not been painful.  However, it has become such a size that she became concerned about it.  CT scan and MRI was performed and this suggested that this may be a  fluid collection possibly consistent with a hematoma.  However, when  seen the patient mass had a very firm consistency, measured 8 cm in  diameter.  It was raised by 2.5 cm from the surface of the scalp.  I  advised the patient that this likely represented a solid tumorous  lesion, although no firm diagnosis could be reached on the basis of the  scan that she had.  An MRI was also performed along the CT scan.  There  were some changes within the bone, however, no gross bony erosion was  noted.  The patient is now taken to the operating room to undergo  surgical biopsy.   PROCEDURE:  The patient was brought to the operating room and placed on  table in the supine position.  After smooth induction of general  endotracheal anesthesia, the head was turned  slightly to the right.  The  left parietooccipital region was exposed and the skin above the lesion  itself was shaved with hair clippers.  The skin was then cleansed with  alcohol and DuraPrep was used to prep the skin.  The area was toweled  out to expose the area of the center of the mass off to the 11 o'clock  side.  The skin was marked with a vertical incision towards the midline.  After infiltrating with 4 mL of local, a vertical incision was made  directly into the lesion.  The lesion itself was very fibrous and exuded  a milky-like fluid which initially was suspicious for pus.  Cultures  both aerobic and anaerobic along with a stat Gram stain was obtained.  A  wedge of the lesion was then cut out.  It was noted to be fairly  fibrous.  There was a gelatinous material associated with it and  biopsies of this gelatinous material were also obtained.  The bone  itself did not appear to be grossly invaded.  However, the gelatinous  tumor scraped easily off the surface.  The frozen section was sent along  with generous portions of tumor that were resected from a small flap  that was created in the subcutaneous space.  Once this was completed,  then hemostasis in the soft tissues was obtained, 2-0 Vicryl was used in  the galea which was noted to be rather friable, and then 3-0 nylon was  used to close the skin and a  singular running stitch.  Dry sterile dressing was applied to the scalp.  The patient tolerated the procedure well and she was returned to the  recovery room after the procedure was completed.  The frozen section  diagnosis by Dr. Verita Lamb suggested the presence of lymphoma.  Permanent sections will confirm the diagnosis.      Stefani Dama, M.D.  Electronically Signed     HJE/MEDQ  D:  02/25/2009  T:  02/26/2009  Job:  [id number]

## 2011-03-23 NOTE — Consult Note (Signed)
[name], [name]              ACCOUNT NO.:  [id number]   MEDICAL RECORD NO.:  [id number]           PATIENT TYPE:   LOCATION:                                 FACILITY:   PHYSICIAN:  Lyn Records, M.D.   DATE OF BIRTH:  [birth year]   DATE OF CONSULTATION:  DATE OF DISCHARGE:                                 CONSULTATION   REASON FOR CONSULTATION:  A 2.62-second pause.   CONCLUSIONS:  1. Atrial fibrillation, chronic.      a.     Triple drug therapy required for adequate rate control to       prevent acute diastolic heart failure (digoxin, metoprolol, and       diltiazem).      b.     A 2.62-second pause, asymptomatic.  2. Acute-on-chronic diastolic heart failure when atrial fibrillation      rate is poorly controlled.  3. Hypertension.  4. Stage IV non-Hodgkin lymphoma.      a.     Chemotherapy.  5. Left lower chest pain following a fall 1 week ago.  6. Altered mental status.   RECOMMENDATIONS:  1. Continue digoxin at the previously prescribed dose of 0.125 mg      daily; not 0.25 mg as initially ordered on admission.  2. Pauses greater than 3.0 seconds would prompt an adjustment in rate      control medications.  3. Left chest x-ray with rib detail.  4. The patient is still not a good candidate for Coumadin therapy      because of frailty and frequent falls.   COMMENTS:  The patient is 55 and has stage IV non-Hodgkin lymphoma.  She  continues to lose weight and was admitted to the hospital on this  occasion with altered mental status.  We are consulted because of a 2.60-  second pause on the monitor.  The patient was asymptomatic.   MEDICATIONS:  Her medications on admission to the hospital included:  1. Multiple vitamins 1 daily.  2. Simvastatin 20 mg daily.  3. Toprol-XL 100 mg twice a day.  4. Aspirin 81 mg per day.  5. Klor-Con 20 mEq daily.  6. Lasix 60 mg daily.  7. Protonix 40 mg daily.  8. Cardizem CD 180 mg daily.  9. Cozaar 100 mg daily.  10.Xanax 0.25  mg every 8 hours as needed.  11.Allopurinol 400 mg 3 times a day.  12.Compazine 10 mg per day.  13.Lanoxin 0.125 mg per day.  14.Hydrocodone.  15.Acetaminophen as needed.   PHYSICAL EXAMINATION:  VITAL SIGNS:  On exam, the patient's blood  pressure is 130/52, heart rate trend ranges between 55 and 102.  The  patient is afebrile.  CHEST:  She has a resolving bruise over the left lateral chest from a  previous fall.  LUNGS:  Clear.  NECK:  Veins are flat.  CARDIAC:  Reveals an irregularly irregular rhythm with significant  increase in heart rate with sitting.  EXTREMITIES:  Reveal no edema.   LABORATORY DATA:  On admission included a hemoglobin of 9.8, white blood  cell count of  7.8, platelet count of 410,000, INR 1.1, creatinine of  1.39 now down to 1.08, and potassium 3.7.  EKG reveals atrial  fibrillation with controlled rate at 86 beats per minute.   DISCUSSION:  The patient is ill and has a very poor prognosis.  Symptomatic control of atrial fibrillation rate can be achieved with her  current medical regimen adjustments in doses may be necessary at the  patient's diet and weight changes.  I would not discontinue digoxin,  however, unless there are pauses greater than 3 seconds or chronic heart  rate is less than 50.      Lyn Records, M.D.  Electronically Signed     HWS/MEDQ  D:  06/03/2009  T:  06/03/2009  Job:  [id number]   cc:   Lajuana Matte, MD  Fax: [phone]

## 2011-03-23 NOTE — Discharge Summary (Signed)
[name], [name]              ACCOUNT NO.:  [id number]   MEDICAL RECORD NO.:  [id number]          PATIENT TYPE:  INP   LOCATION:  1425                         FACILITY:  [facility]   PHYSICIAN:  Lajuana Matte, MD  DATE OF BIRTH:  [birth year]   DATE OF ADMISSION:  04/30/2009  DATE OF DISCHARGE:  05/07/2009                               DISCHARGE SUMMARY   DISCHARGE DIAGNOSES:  1. Stage IV non-Hodgkin's lymphoma.  2. Atrial fibrillation.  3. Neutropenia.  4. Dehydration.  5. Anemia.  6. Dyspnea.  7. Constipation.  8. Hypertension.  9. Hypokalemia.  10.Hypomagnesemia.  11.Hyperkalemia.  12.Thrombocytopenia.   HISTORY:  [name] is a very pleasant 77 year old white female  diagnosed with stage IV large B-cell non-Hodgkin's lymphoma in April  2010 when she presented with a large mass in the occipital region.  Patient was also found to have extensive lymphadenopathy.  She is status  post 2 cycles of systemic chemotherapy with CHOP/Rituxan last given April 23, 2009, with the last injection given on April 24, 2009.  Prior to  admission, she complained of increasing fatigue and generalized weakness  with poor p.o. intake and some shaking movements in her hands.  She  presented to the [facility] for scheduled blood work on the  day of admission and was found to have a total white blood cell count of  less than 0.2 with a hemoglobin of 8.5, hematocrit of 26.1 and platelets  of 92,000.  CMET revealed a potassium of 3.  Patient was admitted for  further evaluation and management of her neutropenia, hypokalemia and  anemia as well as management of her dehydration.   HOSPITAL COURSE:  The patient was admitted to the inpatient oncology  service and supported with IV fluids.  She was empirically covered with  a course of Avelox.  She was placed on neutropenic precautions as well  as a neutropenic diet.  For anemia, she was transfused 2 units packed  red blood cells with  improvement and stabilization of her hemoglobin and  hematocrit receiving a total of 4 units of packed red blood cell  transfusion throughout the admission.  On hospital day 2, she was found  to have irregularly irregular heart rate on physical examination.  EKG  was obtained, and the patient was transferred to a telemetry floor as  well as a cardiology consult called to Dr. Verdis Prime, the patient's  cardiologist.  The patient was found to have new onset atrial  fibrillation and was placed on a Cardizem drip as well as continued on  her Lopressor.  For hypokalemia as well as hypomagnesemia, these were  repleted.  Her thrombocytopenia was monitored and gradually improved.  For generalized weakness, PT/OT consult was obtained.  For constipation,  patient was placed on Senokot.  For GI prophylaxis, she was treated with  Protonix.  For DVT prophylaxis, she was placed on sequential compressive  devices.  She was continued on her Lopressor for her hypertension.  With  her dyspnea, she was found to have O2 saturations in the 87% range with  ambulation, and she  was discharged with home oxygen.   DISCHARGE CONDITION:  Fair.   DISCHARGE DIET:  No restrictions.   DISCHARGE ACTIVITY:  Increase activity slowly and walk with assistance  until stronger.  Work with home physical therapy.   DISCHARGE MEDICATIONS:  1. Cozaar 100 mg p.o. daily.  2. Lopressor 100 mg p.o. b.i.d.  3. Zocor 20 mg p.o. daily.  4. Allopurinol 100 mg p.o. t.i.d.  5. Aspirin 325 mg p.o. daily.  6. Digoxin 0.25 mg p.o. daily.  7. Cardizem 180 mg p.o. daily.  8. Lasix 40 mg p.o. daily.  9. Xanax 0.25 mg p.o. q.8 h. as needed for anxiety.  10.Senokot 2 tabs p.o. b.i.d. p.r.n. constipation.  11.Compazine 5 mg p.o. q.6 h. as needed for nausea.  12.Protonix 40 mg p.o. daily.   DISCHARGE FOLLOWUP:  The patient is to follow up as previously scheduled  on May 13, 2009, with Dr. Arbutus Ped and is to follow up with Dr. Katrinka Blazing on   May 09, 2009, at 10:30 a.m.      Gus Height, PA.      Lajuana Matte, MD  Electronically Signed    AJ/MEDQ  D:  05/16/2009  T:  05/17/2009  Job:  [id number]   cc:   Lyn Records, M.D.  Fax: [phone]

## 2011-03-23 NOTE — Consult Note (Signed)
[name], [name]              ACCOUNT NO.:  [id number]   MEDICAL RECORD NO.:  [id number]          PATIENT TYPE:  INP   LOCATION:  1425                         FACILITY:  [facility]   PHYSICIAN:  Jake Bathe, MD      DATE OF BIRTH:  22-May-1927   DATE OF CONSULTATION:  05/01/2009  DATE OF DISCHARGE:                                 CONSULTATION   CARDIOLOGIST:  Dr. Verdis Prime.   REASON FOR CONSULTATION:  Evaluation of atrial fibrillation, rapid  ventricular response.   HISTORY OF PRESENT ILLNESS:  A pleasant 77 year old female with stage IV  diffuse large B-cell non-Hodgkin's lymphoma with prior large mass in the  occipital region with extensive lymphadenopathy who has been  hospitalized for neutropenia, dehydration and anemia after undergoing  two cycles of systemic chemotherapy with CHOP/reduction.  Her prior  cardiovascular history includes coronary artery disease status post  bypass in 1994, as well as hypertension, hyperlipidemia.  Earlier this  morning, she was noted to have an irregular tachycardia at a heart rate  of approximately 139 beats per minute.  An EKG was obtained which  verified atrial fibrillation, rapid ventricular response, a heart rate  of 149 beats per minute with nonspecific ST-T wave changes/LVH.  Her  last echocardiogram prior to chemotherapy demonstrated a normal ejection  fraction of 65% with mild asymmetric septal hypertrophy.  Currently, she  is asymptomatic without any chest pain or shortness of breath and is  quite comfortable lying in bed.  She denies any fevers, chills, nausea,  chest pain, shortness of breath, orthopnea, edema.   Currently, she is neutropenic and anemic.  Her potassium level this  morning also was 3.2.  Creatinine is 0.8.   PAST MEDICAL HISTORY:  As above including bypass, LIMA to LAD in 1994.   ALLERGIES:  IN HER OFFICE NOTE, THERE IS AN ALLERGY LISTED AS GINGIVAL  HYPERTROPHY WITH CALCIUM CHANNEL BLOCKERS; HOWEVER, SHE HAS  BEEN ON  SULAR AS AN OUTPATIENT, 8.5 MG TWICE A DAY.  SHE IS NOT CURRENTLY ON  THIS DURING THIS HOSPITALIZATION.   MEDICATIONS AS AN OUTPATIENT:  1. Aspirin 81 mg.  2. Fish oil.  3. Vitamin D.  4. Sular 8.5 mg twice a day.  5. Hyzaar 100/25 mg once a day.  6. Metoprolol tartrate 100 mg twice a day.  7. Zocor 20 mg once a day.   MEDICATIONS IN HOSPITAL:  Here, she is receiving:  1. Avelox.  2. Neupogen.  3. Cozaar 100 mg once a day.  4. Hydrochlorothiazide 25 mg once a day.  5. Metoprolol 100 mg twice a day.  6. Simvastatin 20 mg once a day.  7. Allopurinol 100 mg t.i.d.  8. Aspirin 325 mg a day.   SOCIAL HISTORY:  She is a nonsmoker, nondrinker, widowed.   FAMILY HISTORY:  Father died at age 29 from myocardial infarction.  Mother died at age 62 from old age with a history of breast cancer.  She has one sister at age 75 who died from colon cancer.   REVIEW OF SYSTEMS:  Unless explained above, all other 12  review of  systems negative.  Occasional aching sensation.  Postoperative posterior  brain mass removed.   PHYSICAL EXAMINATION:  VITAL SIGNS:  Pulse currently 149, irregular,  blood pressure 143/88 to 109/89, satting 94% on room air, temperature  98.3.  GENERAL:  Alert and oriented x3, laying comfortably in bed in no acute  distress.  HEENT:  Eyes - pale conjunctivae.  Extraocular movements intact.  No  scleral icterus noted.  Healing surgical incision posterior skull.  NECK:  Supple.  No carotid bruits.  Normal carotid pulses.  No JVD  noted.  CHEST:  Irregularly irregular, tachycardiac.  Normal S1-S2.  No murmurs  appreciated.  Normal PMI.  LUNGS:  Clear to auscultation bilaterally.  No wheezes.  No rales.  Normal respiratory effort.  ABDOMEN:  Soft, nontender, normoactive bowel sounds.  No masses  appreciated.  No bruits.  EXTREMITIES:  Trace edema bilateral lower extremities with 2+ distal  pulses.  SKIN:  Warm, dry and intact.  No rashes.  Posterior head wound  noted.  NEUROLOGIC:  Nonfocal.  There is a subtle resting hand tremor noted in  her right hand when she is talking.  PSYCHIATRIC:  Normal affect.   LABORATORY DATA:  White count 0.4 less than, hemoglobin 9.9, hematocrit  28.5, platelets 60.  LDH 159.  Sodium 136, potassium 3.2, BUN 21,  creatinine 0.8, CO2 is 30, total bilirubin 2.3.  AST and ALT are normal.  Total protein is 4.7, albumin 2.7.   ASSESSMENT/PLAN:  An 77 year old female with stage IV lymphoma with  coronary artery disease status post bypass in 1994 with new-onset atrial  fibrillation, rapid ventricular response.   PLAN:  Start Diltiazem IV drip starting at 5 mg per hour, titrating  upward to 15 mg per hour with an initial 10 mg IV push.  This is in  conjunction to her metoprolol 100 mg twice a day beta-blocker.  Hopefully, this will help with rapid ventricular response and decrease  her heart rate.  Continue to maintain potassium level greater than 4.  I  will replete with 40 mEq of potassium.  May need daily potassium.  I  will also check a TSH.  Her most recent echocardiogram in late April  demonstrated normal ejection fraction; however, given her recent  chemotherapy rounds, I will repeat echocardiogram to ensure that there  is no change or pericardial effusion or other signs of irritation  causing her atrial fibrillation.  Try to maintain hemoglobin greater  than 8, hematocrit greater than 25.  Neutropenia is noted.  She is  afebrile currently.  With her relative thrombocytopenia, note that she  is currently on aspirin 325 because of her coronary artery disease.  She  she may need discontinuation of this.  Will defer to Dr. Shirline Frees.   Will follow along with you and relay findings to Dr. Verdis Prime.     Jake Bathe, MD  Electronically Signed    MCS/MEDQ  D:  05/01/2009  T:  05/01/2009  Job:  [id number]

## 2011-03-23 NOTE — H&P (Signed)
[name], [name]              ACCOUNT NO.:  [id number]   MEDICAL RECORD NO.:  [id number]          PATIENT TYPE:  EMS   LOCATION:  ED                           [facility]ACILITY:  [facility]   PHYSICIAN:  Lajuana Matte, MD  DATE OF BIRTH:  08-21-1927   DATE OF ADMISSION:  04/30/2009  DATE OF DISCHARGE:                              HISTORY & PHYSICAL   REASON FOR ADMISSION:  An 77 year old white female with history of stage  IV non-Hodgkin's lymphoma, currently presenting with neutropenia,  dehydration and anemia.   HISTORY:  [name] is a very pleasant 77 year old white female  diagnosed with stage IV diffuse large B-cell non-Hodgkin lymphoma in  April 2010 when she presented with a large mass in the occipital region.  The patient was also found to have extensive lymphadenopathy.  She is  status post two cycles of systemic chemotherapy with CHOP/reduction.  The last dose was given on April 23, 2009 with Neulasta injection on April 24, 2009.  The patient has been complaining recently of increasing  fatigue and weakness as well as poor p.o. intake and shaking movement in  her hands.  She was at the [facility] today for repeat blood  work.  She had CBC and CMET performed which showed total white blood  count was less than 0.2.  Her hemoglobin was 8.5, hematocrit 26.1,  platelet count 92,000.  The CMET showed a low potassium of 3.0.  The  patient will be admitted today for reevaluation and treatment of her  neutropenia, hypokalemia and anemia in addition to management of her  dehydration.  When seen today, she is feeling fine except for the  fatigue and weakness.  She was unable to do much at home recently.   REVIEW OF SYSTEMS:  She has no fever but she has some shaking movement  in her body, especially the hands.  No headache, no blurry vision or  double vision.  She has no chest pain or shortness of breath.  She has  no cough or hemoptysis, syncope or palpitation.  No nausea,  vomiting or  abdominal pain, diarrhea, constipation, melena or hematochezia.  No  dysuria or hematuria.   PAST MEDICAL HISTORY:  Significant for:  1. Non-Hodgkin lymphoma.  2. Hypertension.  3. Hypercholesterolemia.  4. History of coronary artery disease status post revision in 1998.  5. History of cataract surgery in 2007.   FAMILY HISTORY:  Mother had colon cancer and breast cancer.  Sister had  stomach cancer and brother had pancreatic cancer.   SOCIAL HISTORY:  She is a widow.  She has three children, two deceased.  Her caregiver is her daughter, Darel Hong.  The patient has no history of  smoking, alcohol or drug abuse.   ALLERGIES:  She is allergic to [facility].   CURRENT HOME MEDICATIONS:  Include Losartan/HCTZ, Compazine,  hydrocodone, Lopressor, simvastatin, Xanax, allopurinol and aspirin.   PHYSICAL EXAM:  VITAL SIGNS: Blood pressure 94/51, pulse 71, respiratory  rate 14, temperature 97.7, oxygen saturation 99%.  GENERAL:  Exam showed a very pleasant 77 year old white female in no  acute  distress.  HEENT: Normocephalic, atraumatic.  Clear oropharynx.  NECK EXAM:  Supple.  No lymphadenopathy.  CHEST: Exam clear to auscultation.  No wheezes or crackles.  CARDIOVASCULAR EXAM:  Normal S1-S2.  No murmur or gallops.  ABDOMINAL EXAM:  soft, nontender, nondistended.  No masses.  EXTREMITIES:  Shows no edema.   LABORATORY DATA:  CBC and CMET performed at the [facility]  today showed white blood count at 0.20, hemoglobin 8.5, hematocrit 26.1,  platelets 92.  Sodium 137, potassium 3.0, glucose 175, BUN 3, creatinine  0.85, total protein 1.7, alkaline phosphatase 74, AST 14, ALT 18,  calcium 8.6, albumin 3.2, LDH 144.   ASSESSMENT/PLAN:  This is a very pleasant 77 year old white female with  history of stage IV non-Hodgkin's lymphoma, currently undergoing  systemic chemotherapy with CHOP/reduction.  The patient has been  admitted today for evaluation and treatment of  neutropenia, anemia,  dehydration and hypokalemia.   PLAN:  1. We will admit the patient to the Oncology service.  2. Start IV hydration with normal saline supplemented with potassium      chloride.  3. Will start the patient on neutropenic diet and precaution.  4. Will start empiric treatment with antibiotic in the form of Avelox.  5. Will start Neupogen.  6  For the anemia, the patient will receive 2 units of packed RBCs  transfusion.  1. Electrolyte imbalance.  Will replace the potassium.  2. For hypertension, she will continue on metoprolol as well as the      Losartan/HCTZ.  9  For hypercholesterolemia, she will continue on Zocor.  1. For GI prophylaxis, the patient will be on Protonix and for DVT      prophylaxis she will be on sequential compression devices.      Lajuana Matte, MD  Electronically Signed     MKM/MEDQ  D:  04/30/2009  T:  04/30/2009  Job:  [id number]   cc:   Duncan Dull, M.D.  Fax: [phone]   Stefani Dama, M.D.  Fax: [phone]   Lyn Records, M.D.  Fax: [phone]   Lurline Hare, MD

## 2011-03-24 ENCOUNTER — Other Ambulatory Visit: Payer: Self-pay | Admitting: Internal Medicine

## 2011-03-24 ENCOUNTER — Other Ambulatory Visit (HOSPITAL_COMMUNITY): Payer: Self-pay

## 2011-03-24 ENCOUNTER — Ambulatory Visit (HOSPITAL_COMMUNITY)
Admission: RE | Admit: 2011-03-24 | Discharge: 2011-03-24 | Disposition: A | Discharge status: Home / Self Care | Payer: Medicare Other | Source: Ambulatory Visit | Attending: Internal Medicine | Admitting: Internal Medicine

## 2011-03-24 ENCOUNTER — Encounter (HOSPITAL_BASED_OUTPATIENT_CLINIC_OR_DEPARTMENT_OTHER): Payer: Medicare Other | Admitting: Internal Medicine

## 2011-03-24 ENCOUNTER — Encounter (HOSPITAL_COMMUNITY): Payer: Self-pay

## 2011-03-24 ENCOUNTER — Inpatient Hospital Stay (HOSPITAL_COMMUNITY): Admission: RE | Admit: 2011-03-24 | Payer: Self-pay | Source: Ambulatory Visit

## 2011-03-24 DIAGNOSIS — D259 Leiomyoma of uterus, unspecified: Secondary | ICD-10-CM | POA: Insufficient documentation

## 2011-03-24 DIAGNOSIS — K5730 Diverticulosis of large intestine without perforation or abscess without bleeding: Secondary | ICD-10-CM | POA: Insufficient documentation

## 2011-03-24 DIAGNOSIS — G9389 Other specified disorders of brain: Secondary | ICD-10-CM | POA: Insufficient documentation

## 2011-03-24 DIAGNOSIS — G319 Degenerative disease of nervous system, unspecified: Secondary | ICD-10-CM | POA: Insufficient documentation

## 2011-03-24 DIAGNOSIS — C8589 Other specified types of non-Hodgkin lymphoma, extranodal and solid organ sites: Secondary | ICD-10-CM | POA: Insufficient documentation

## 2011-03-24 DIAGNOSIS — C8590 Non-Hodgkin lymphoma, unspecified, unspecified site: Secondary | ICD-10-CM

## 2011-03-24 DIAGNOSIS — J984 Other disorders of lung: Secondary | ICD-10-CM | POA: Insufficient documentation

## 2011-03-24 DIAGNOSIS — K7689 Other specified diseases of liver: Secondary | ICD-10-CM | POA: Insufficient documentation

## 2011-03-24 DIAGNOSIS — E041 Nontoxic single thyroid nodule: Secondary | ICD-10-CM | POA: Insufficient documentation

## 2011-03-24 DIAGNOSIS — J90 Pleural effusion, not elsewhere classified: Secondary | ICD-10-CM | POA: Insufficient documentation

## 2011-03-24 DIAGNOSIS — M810 Age-related osteoporosis without current pathological fracture: Secondary | ICD-10-CM | POA: Insufficient documentation

## 2011-03-24 HISTORY — DX: Non-Hodgkin lymphoma, unspecified, unspecified site: C85.90

## 2011-03-24 LAB — CBC WITH DIFFERENTIAL/PLATELET
BASO%: 0.3 % (ref 0.0–2.0)
Eosinophils Absolute: 0.1 10*3/uL (ref 0.0–0.5)
MONO#: 0.5 10*3/uL (ref 0.1–0.9)
MONO%: 8.4 % (ref 0.0–14.0)
NEUT#: 3.8 10*3/uL (ref 1.5–6.5)
RBC: 4.48 10*6/uL (ref 3.70–5.45)
RDW: 17.5 % — ABNORMAL HIGH (ref 11.2–14.5)
WBC: 5.4 10*3/uL (ref 3.9–10.3)

## 2011-03-24 LAB — CMP (CANCER CENTER ONLY)
ALT(SGPT): 47 U/L (ref 10–47)
Albumin: 3.9 g/dL (ref 3.3–5.5)
Alkaline Phosphatase: 112 U/L — ABNORMAL HIGH (ref 26–84)
CO2: 29 mEq/L (ref 18–33)
Glucose, Bld: 123 mg/dL — ABNORMAL HIGH (ref 73–118)
Potassium: 3.6 mEq/L (ref 3.3–4.7)
Sodium: 145 mEq/L (ref 128–145)
Total Protein: 6.3 g/dL — ABNORMAL LOW (ref 6.4–8.1)

## 2011-03-24 MED ORDER — IOHEXOL 300 MG/ML  SOLN
100.0000 mL | Freq: Once | INTRAMUSCULAR | Status: AC | PRN
  Administered 2011-03-24: 100 mL via INTRAVENOUS

## 2011-03-26 NOTE — Op Note (Signed)
   NAMESAINA, WAAGE                        ACCOUNT NO.:  [id number]   MEDICAL RECORD NO.:  [id number]                   PATIENT TYPE:  AMB   LOCATION:  ENDO                                 FACILITY:  [facility]   PHYSICIAN:  John C. Madilyn Fireman, M.D.                 DATE OF BIRTH:  Oct 30, 1927   DATE OF PROCEDURE:  04/10/2003  DATE OF DISCHARGE:                                 OPERATIVE REPORT   PROCEDURE:  Colonoscopy.   INDICATIONS FOR PROCEDURE:  Family history of colon cancer in a first degree  relative. Also history of adenomatous colon polyp three months ago.   DESCRIPTION OF PROCEDURE:  The patient was placed in the left lateral  decubitus position then placed on the pulse monitor with continuous low flow  oxygen delivered by nasal cannula. She was sedated with 87.5 mcg IV fentanyl  and 8 mg IV Versed. The Olympus video colonoscope was inserted into the  rectum and advanced to the cecum, confirmed by transillumination at  McBurney's point and visualization of the ileocecal valve and appendiceal  orifice. The prep was excellent. The cecum, ascending, transverse and  descending colon all appeared normal with no masses, polyps, diverticula or  other mucosal abnormalities. Within the sigmoid colon there were seen a few  scattered diverticula. In the rectum at 10 cm, there was an 8 mm polyp which  was removed by snare. The remainder of the rectum appeared normal. The scope  was then withdrawn and the patient returned to the recovery room in stable  condition. The patient tolerated the procedure well and there were no  immediate complications.   IMPRESSION:  1. Rectal polyp.  2. Sigmoid diverticulosis.   PLAN:  Await histology and probably repeat colonoscopy in three years.                                               John C. Madilyn Fireman, M.D.    JCH/MEDQ  D:  04/10/2003  T:  04/10/2003  Job:  [id number]   cc:   Duncan Dull, M.D.  [address]  [place]  [place] [address]  Fax: [phone]

## 2011-03-26 NOTE — Procedures (Signed)
[facility]  Patient:    [name], [name]                     MRN: [id number] Proc. Date: 03/30/00 Adm. Date:  [id number] Disc. Date: [id number] Attending:  Louie Bun CC:         Duncan Dull, M.D.                           Procedure Report  PROCEDURE PERFORMED:  Colonoscopy.  ENDOSCOPIST:  Everardo All. Madilyn Fireman, M.D.  INDICATIONS FOR PROCEDURE:  Strong family history of colon cancer reportedly in three first degree relatives.  DESCRIPTION OF PROCEDURE:  The patient was placed in the left lateral decubitus position and placed on the pulse monitor and continuous low flow oxygen delivered by nasal cannula.  She was sedated with 50 mg IV Demerol and 5 mg IV Versed.  The Olympus video colonoscope was inserted into the rectum and advanced to the cecum, confirmed by transillumination of McBurneys point and visualization of the ileocecal valve and appendiceal orifice.  The prep was excellent.  The cecum, ascending, transverse and descending colon appeared normal with no masses, polyps, diver5ticula or other mucosal abnormalities. In the sigmoid colon were seen several diverticula and no other abnormalilties.  At the rectosigmoid junction was an 8 mm sessile polyp which was fulgurated by hot biopsy.  The remainder of the rectum appeared normal and retroflex view of the anus revealed no obvious internal hemorrhoids.  The colonoscope was then withdrawn and the patient returned to the recovery room in stable condition.  The patient tolerated the procedure well and there were no immediate complications.  IMPRESSION: 1. Rectosigmoid colon polyp. 2. Sigmoid diverticulosis.  PLAN:  Await histology and will need colonoscopy in three to five years depending on results. DD:  03/30/00 TD:  04/04/00 Job: [id number]

## 2011-03-26 NOTE — Cardiovascular Report (Signed)
[name], [name]                        ACCOUNT NO.:  [id number]   MEDICAL RECORD NO.:  [id number]                   PATIENT TYPE:  OIB   LOCATION:  6501                                 FACILITY:  [facility]   PHYSICIAN:  Lesleigh Noe, M.D.            DATE OF BIRTH:  05/24/1927   DATE OF PROCEDURE:  10/30/2003  DATE OF DISCHARGE:                              CARDIAC CATHETERIZATION   INDICATIONS FOR PROCEDURE:  Abnormal Cardiolite study demonstrating  inferolateral ischemia and small region of anterolateral ischemia consistent  with diagonal stenosis.   DATE OF PROCEDURE:  October 30, 2003.   PROCEDURE PERFORMED:  1. Left heart catheterization.  2. Selective coronary angiography.  3. Left ventriculography.  4. Internal mammary artery angiography.   DESCRIPTION:  After informed consent, a 5-French sheath was placed in the  right femoral artery using modified Seldinger technique.  A 5-French JR-4  catheter was used for hemodynamic recordings, left ventriculography by hand  injection, right coronary angiography and internal mammary artery  angiography.  We then placed a 5-French left Judkins catheter and performed  left coronary angiography.  The patient tolerated the procedure without  complications.   RESULTS:   I. HEMODYNAMIC DATA:  A.  Aortic pressure 172/61.  B.  Left ventricular pressure 174/10.   II. LEFT VENTRICULOGRAPHY:  The left ventricle is normal in size.  Overall  contractility is normal.  The EF is greater than or equal to 60%.  No MR  noted.   III. CORONARY ANGIOGRAPHY:  A.  Left main coronary:  The left main coronary  artery is widely patent.  Distal minimal luminal irregularities are noted.  B.  Left anterior descending coronary:  This a large vessel that reaches the  left ventricular apex.  There is a fluent distal LAD.  The LAD is supplied  by a internal mammary graft.  The proximal vessel contains diffuse segmental  70-85% stenosis both before  and after a large diagonal branch.  The diagonal  branch contains 75-85% narrowing.  C.  Circumflex artery:  The circumflex coronary artery is large.  It gives  origin to four obtuse marginal branches.  After the third obtuse marginal,  the vessel is essentially totally occluded with faint distal opacification  and competitive flow due to what appears to be well formed collaterals.  The  first obtuse marginal is large. The second obtuse marginal is large.  The  third obtuse marginal is very small. The fourth obtuse marginal is small to  moderate in size.  D.  Right coronary:  The right coronary artery contains 40% mid vessel  narrowing.  The PDA contains 60-70% ostial narrowing.  No high grade  obstruction is noted in the right.  The right coronary contains ___________  proximal.   IV. LEFT INTERNAL MAMMARY TO THE LEFT ANTERIOR DESCENDING:  This graft is  widely patent.  It flows freely into the left anterior descending.  There is  competitive flow in the left anterior descending with native vessel.   CONCLUSIONS:  1. Normal left ventricular function as noted above.  2. Significant coronary disease with segmental 80% stenosis in the proximal     and mid left anterior descending.  Also, 70-80% stenosis in the ostium of     the first diagonal.  The diagonal lesion probably accounts for the small     focus of anterobasal ischemia.  There is also essential total occlusion     of the distal circumflex with the fourth obtuse marginal collateralized     from the left as well.  The right coronary artery contains mild-to-     moderate disease.  3. Widely patent left internal mammary artery to the left anterior     descending.   PLAN:  1. Continue medical therapy.  2. Aggressive blood pressure control.   I probably should have performed an abdominal aortogram to evaluate renal  arteries, but I did not do this.  Should blood pressure continue to be a  difficult management problem, should  consider either CT of MRI imaging of  renal arteries to rule out renal artery stenosis.                                               Lesleigh Noe, M.D.    HWS/MEDQ  D:  10/30/2003  T:  10/30/2003  Job:  [id number]   cc:   Duncan Dull, M.D.  [address]  [place]  [place] [address]  Fax: [phone]

## 2011-03-29 NOTE — Procedures (Unsigned)
VASCULAR LAB EXAM  INDICATION:  Follow up right popliteal artery embolectomy.  HISTORY: Diabetes:  No. Cardiac:  No. Hypertension:  Yes.  EXAM:  Right lower extremity arterial duplex.  IMPRESSION: 1. Patent right lower extremity arterial system following right     popliteal artery embolectomy. 2. Excessive edema caused limited visualization of the right calf. 3. Area of fluid observed in the proximal calf measuring 3.5 cm x 0.5     cm.      ___________________________________________ V. Charlena Cross, MD  EM/MEDQ  D:  03/22/2011  T:  03/22/2011  Job:  [id number]

## 2011-03-31 ENCOUNTER — Encounter (HOSPITAL_BASED_OUTPATIENT_CLINIC_OR_DEPARTMENT_OTHER): Payer: Medicare Other | Admitting: Internal Medicine

## 2011-03-31 ENCOUNTER — Other Ambulatory Visit: Payer: Self-pay | Admitting: Internal Medicine

## 2011-03-31 DIAGNOSIS — I82509 Chronic embolism and thrombosis of unspecified deep veins of unspecified lower extremity: Secondary | ICD-10-CM

## 2011-03-31 DIAGNOSIS — C8589 Other specified types of non-Hodgkin lymphoma, extranodal and solid organ sites: Secondary | ICD-10-CM

## 2011-03-31 DIAGNOSIS — Z7901 Long term (current) use of anticoagulants: Secondary | ICD-10-CM

## 2011-03-31 DIAGNOSIS — C8590 Non-Hodgkin lymphoma, unspecified, unspecified site: Secondary | ICD-10-CM

## 2011-06-28 ENCOUNTER — Other Ambulatory Visit (HOSPITAL_COMMUNITY): Payer: Self-pay | Admitting: Family Medicine

## 2011-06-28 DIAGNOSIS — Z1231 Encounter for screening mammogram for malignant neoplasm of breast: Secondary | ICD-10-CM

## 2011-07-07 ENCOUNTER — Ambulatory Visit (HOSPITAL_COMMUNITY): Payer: Medicare Other

## 2011-07-14 ENCOUNTER — Ambulatory Visit (HOSPITAL_COMMUNITY)
Admission: RE | Admit: 2011-07-14 | Discharge: 2011-07-14 | Disposition: A | Discharge status: Home / Self Care | Payer: Medicare Other | Source: Ambulatory Visit | Attending: Family Medicine | Admitting: Family Medicine

## 2011-07-14 DIAGNOSIS — Z1231 Encounter for screening mammogram for malignant neoplasm of breast: Secondary | ICD-10-CM | POA: Insufficient documentation

## 2011-09-21 ENCOUNTER — Telehealth: Payer: Self-pay | Admitting: Internal Medicine

## 2011-09-21 NOTE — Telephone Encounter (Signed)
S/w pt Nov 2012 md appt has been cancelled due to Epic and that we will call her back to r/s. Also reminded pt of lb/ct appt on 11/26. Pt verbalized understanding.

## 2011-09-27 ENCOUNTER — Other Ambulatory Visit (HOSPITAL_COMMUNITY): Payer: Medicare Other

## 2011-10-04 ENCOUNTER — Other Ambulatory Visit: Payer: Self-pay | Admitting: Internal Medicine

## 2011-10-04 ENCOUNTER — Ambulatory Visit (HOSPITAL_COMMUNITY)
Admission: RE | Admit: 2011-10-04 | Discharge: 2011-10-04 | Disposition: A | Discharge status: Home / Self Care | Payer: Medicare Other | Source: Ambulatory Visit | Attending: Internal Medicine | Admitting: Internal Medicine

## 2011-10-04 ENCOUNTER — Other Ambulatory Visit (HOSPITAL_BASED_OUTPATIENT_CLINIC_OR_DEPARTMENT_OTHER): Payer: Medicare Other | Admitting: Lab

## 2011-10-04 DIAGNOSIS — C8590 Non-Hodgkin lymphoma, unspecified, unspecified site: Secondary | ICD-10-CM

## 2011-10-04 DIAGNOSIS — K5730 Diverticulosis of large intestine without perforation or abscess without bleeding: Secondary | ICD-10-CM | POA: Insufficient documentation

## 2011-10-04 DIAGNOSIS — I82509 Chronic embolism and thrombosis of unspecified deep veins of unspecified lower extremity: Secondary | ICD-10-CM

## 2011-10-04 DIAGNOSIS — C8589 Other specified types of non-Hodgkin lymphoma, extranodal and solid organ sites: Secondary | ICD-10-CM | POA: Insufficient documentation

## 2011-10-04 DIAGNOSIS — I629 Nontraumatic intracranial hemorrhage, unspecified: Secondary | ICD-10-CM | POA: Insufficient documentation

## 2011-10-04 DIAGNOSIS — J90 Pleural effusion, not elsewhere classified: Secondary | ICD-10-CM | POA: Insufficient documentation

## 2011-10-04 LAB — CBC WITH DIFFERENTIAL/PLATELET
BASO%: 0.3 % (ref 0.0–2.0)
Eosinophils Absolute: 0.1 10*3/uL (ref 0.0–0.5)
MCHC: 33.7 g/dL (ref 31.5–36.0)
MONO#: 0.6 10*3/uL (ref 0.1–0.9)
NEUT#: 3.8 10*3/uL (ref 1.5–6.5)
RBC: 4.5 10*6/uL (ref 3.70–5.45)
RDW: 15.8 % — ABNORMAL HIGH (ref 11.2–14.5)
WBC: 5.9 10*3/uL (ref 3.9–10.3)

## 2011-10-04 LAB — CMP (CANCER CENTER ONLY)
AST: 19 U/L (ref 11–38)
Alkaline Phosphatase: 117 U/L — ABNORMAL HIGH (ref 26–84)
BUN, Bld: 22 mg/dL (ref 7–22)
Glucose, Bld: 113 mg/dL (ref 73–118)
Potassium: 3.8 mEq/L (ref 3.3–4.7)
Sodium: 142 mEq/L (ref 128–145)
Total Bilirubin: 0.7 mg/dl (ref 0.20–1.60)
Total Protein: 6.3 g/dL — ABNORMAL LOW (ref 6.4–8.1)

## 2011-10-04 MED ORDER — IOHEXOL 300 MG/ML  SOLN
100.0000 mL | Freq: Once | INTRAMUSCULAR | Status: AC | PRN
  Administered 2011-10-04: 100 mL via INTRAVENOUS

## 2011-10-13 ENCOUNTER — Telehealth: Payer: Self-pay | Admitting: Internal Medicine

## 2011-10-13 NOTE — Telephone Encounter (Signed)
S/w pt, gave appt 12/14/11 @ 9.30am r/s'd from 11/21 appt cx'd due to Epic. Pt says she did her ct scan in Nov and has not heard from anyone regarding her results. She says she wants DrMM or RN to call her to discuss. Lvm for desk nurse asking she or md contact pt to go over ct scan results.

## 2011-10-14 ENCOUNTER — Other Ambulatory Visit: Payer: Self-pay | Admitting: Internal Medicine

## 2011-10-14 NOTE — Progress Notes (Signed)
Daughter [name] called and requested CT results be called to pt  Because next appt is Feb 2013. Per Dr. Donnald Garre schedule pt in next 2 weeks .Request for appt sent to scheduler for f/u in 2 weeks .

## 2011-10-18 ENCOUNTER — Telehealth: Payer: Self-pay | Admitting: Internal Medicine

## 2011-10-18 NOTE — Telephone Encounter (Signed)
Called pt, left message regarding appt in January

## 2011-11-11 IMAGING — CR DG CHEST 1V PORT
1 series · 1 of 1 positions shown · non-contrast
Comparison: Chest radiograph 11/20/2010

CLINICAL DATA: Fracture, pneumonia

PORTABLE CHEST - 1 VIEW

[view not recorded]
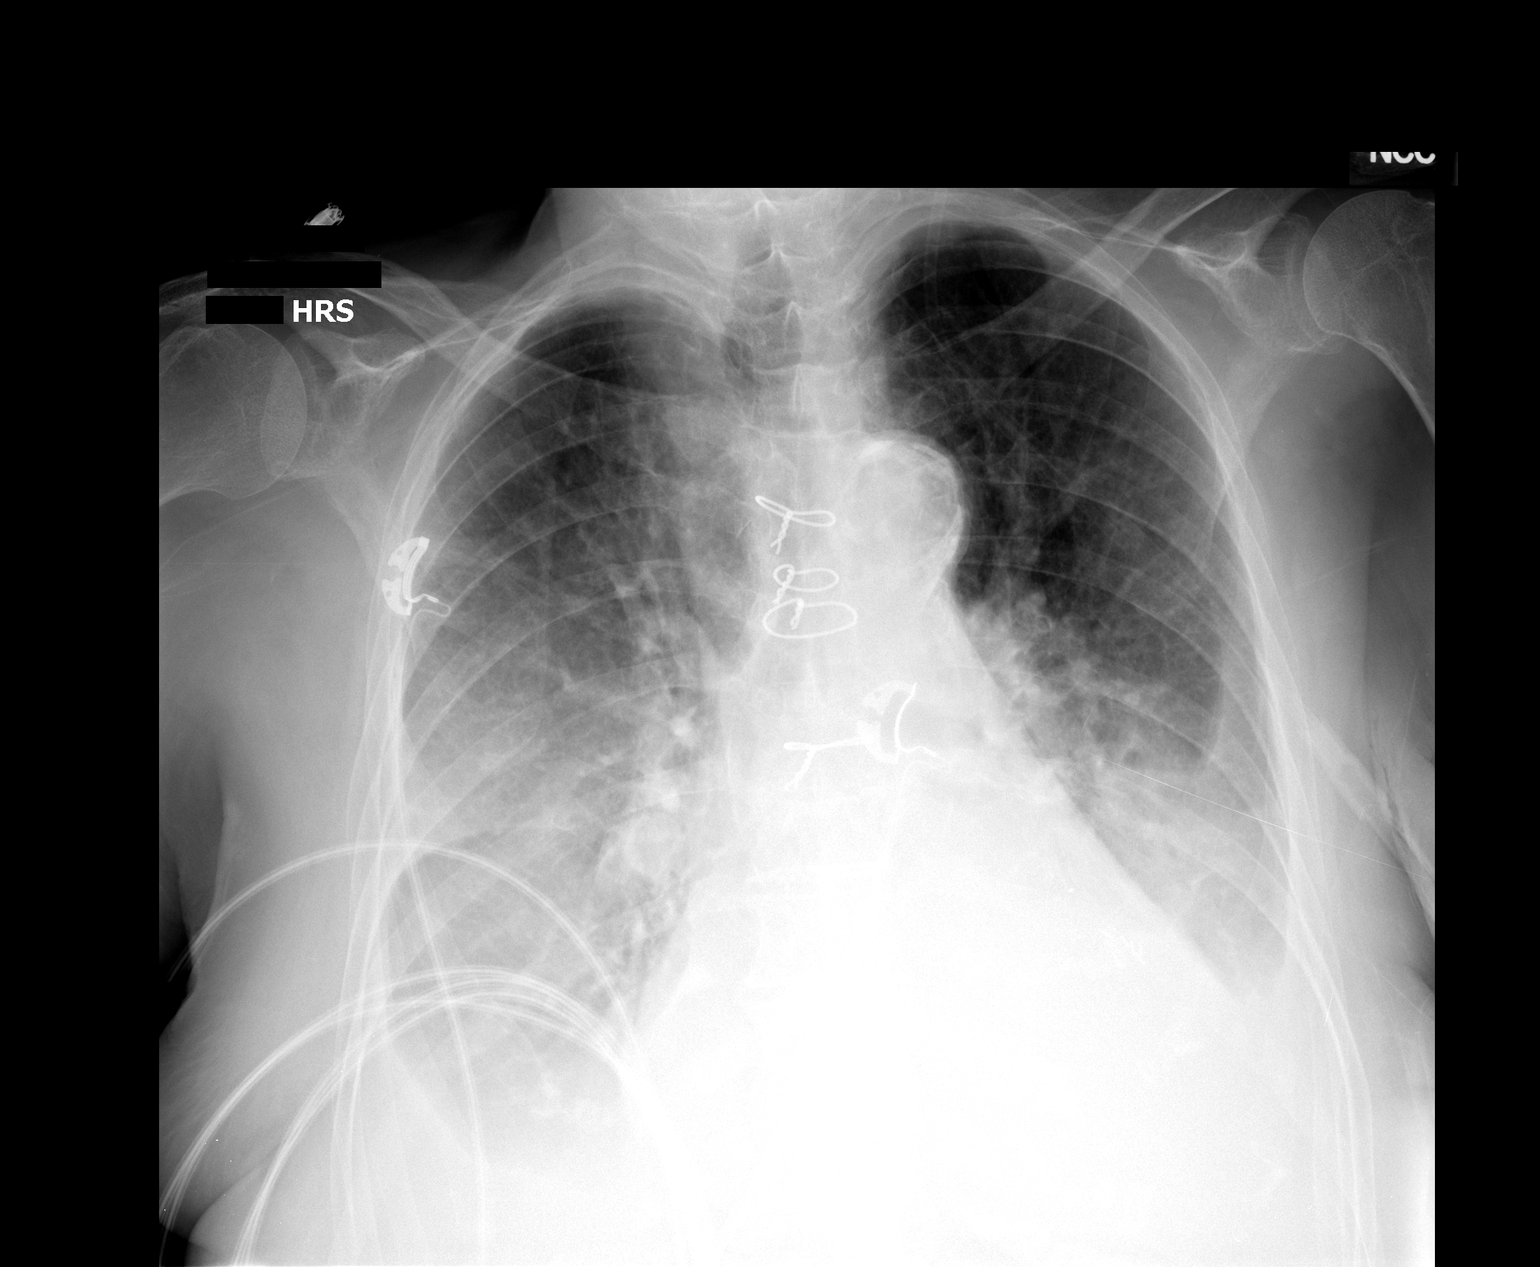

[1 of 1 positions shown; findings below may reference images not displayed]

FINDINGS: Sternotomy wires overlie stable enlarged heart
silhouette.  There are bilateral pleural effusions which slightly
increased compared to prior. Mild increased central venous
pulmonary congestion.  No pneumothorax.
IMPRESSION: Increasing bilateral pleural effusions with cardiomegaly.

Mild increase in central venous congestion.

## 2011-11-12 ENCOUNTER — Other Ambulatory Visit: Payer: Self-pay | Admitting: *Deleted

## 2011-11-12 DIAGNOSIS — C3490 Malignant neoplasm of unspecified part of unspecified bronchus or lung: Secondary | ICD-10-CM

## 2011-11-15 ENCOUNTER — Ambulatory Visit (HOSPITAL_BASED_OUTPATIENT_CLINIC_OR_DEPARTMENT_OTHER): Payer: Medicare Other | Admitting: Internal Medicine

## 2011-11-15 ENCOUNTER — Other Ambulatory Visit (HOSPITAL_BASED_OUTPATIENT_CLINIC_OR_DEPARTMENT_OTHER): Payer: Medicare Other | Admitting: Lab

## 2011-11-15 ENCOUNTER — Telehealth: Payer: Self-pay | Admitting: Internal Medicine

## 2011-11-15 VITALS — BP 136/68 | HR 70 | Temp 98.2°F | Wt 150.8 lb

## 2011-11-15 DIAGNOSIS — C8591 Non-Hodgkin lymphoma, unspecified, lymph nodes of head, face, and neck: Secondary | ICD-10-CM

## 2011-11-15 DIAGNOSIS — C8589 Other specified types of non-Hodgkin lymphoma, extranodal and solid organ sites: Secondary | ICD-10-CM

## 2011-11-15 DIAGNOSIS — C8590 Non-Hodgkin lymphoma, unspecified, unspecified site: Secondary | ICD-10-CM

## 2011-11-15 DIAGNOSIS — C3490 Malignant neoplasm of unspecified part of unspecified bronchus or lung: Secondary | ICD-10-CM

## 2011-11-15 LAB — CBC WITH DIFFERENTIAL/PLATELET
Basophils Absolute: 0 10*3/uL (ref 0.0–0.1)
EOS%: 1.4 % (ref 0.0–7.0)
Eosinophils Absolute: 0.1 10*3/uL (ref 0.0–0.5)
HGB: 13.2 g/dL (ref 11.6–15.9)
LYMPH%: 17.4 % (ref 14.0–49.7)
MCH: 30.3 pg (ref 25.1–34.0)
MCV: 89.8 fL (ref 79.5–101.0)
MONO%: 7.4 % (ref 0.0–14.0)
NEUT#: 4.2 10*3/uL (ref 1.5–6.5)
Platelets: 252 10*3/uL (ref 145–400)

## 2011-11-15 LAB — COMPREHENSIVE METABOLIC PANEL
AST: 29 U/L (ref 0–37)
Albumin: 4.3 g/dL (ref 3.5–5.2)
Alkaline Phosphatase: 113 U/L (ref 39–117)
BUN: 26 mg/dL — ABNORMAL HIGH (ref 6–23)
Creatinine, Ser: 0.96 mg/dL (ref 0.50–1.10)
Glucose, Bld: 98 mg/dL (ref 70–99)
Total Bilirubin: 0.8 mg/dL (ref 0.3–1.2)

## 2011-11-15 NOTE — Telephone Encounter (Signed)
gve the pt her July 2013 appt calendar along with the ct scan appt with instructions.

## 2011-11-15 NOTE — Progress Notes (Signed)
[facility] OFFICE PROGRESS NOTE  [name], MD, MD [address] [place] [address]  PRINCIPAL DIAGNOSIS:  Stage IV large B-cell non-Hodgkin lymphoma diagnosed in April 2010.  PRIOR THERAPY.:   1. Status post 6 cycles of systemic chemotherapy with CHOP/Rituxan.  Last dose was given July 09, 2009. 2. Status post 6 cycles maintenance recurrence in 375 mg/sq. m.  Last dose was given September 11, 2010.  CURRENT THERAPY:  Observation.  INTERVAL HISTORY: [name] 78 y.o. female returns to the clinic today for routine six-month followup visit. The patient feels very well today. She has no complaints except for swelling in the lower extremities secondary to congestive heart failure which is followed by Dr. Katrinka Blazing. She denied having any significant weight loss or night sweats, no chest pain or shortness of breath. She has repeat CT scan of the chest, abdomen and pelvis performed recently and she is here today for evaluation and discussion of her scan results.  MEDICAL HISTORY: Past Medical History  Diagnosis Date  . NHL (non-Hodgkin's lymphoma)     nhl dx 2009    ALLERGIES:   has no known allergies.  MEDICATIONS:  Current Outpatient Prescriptions  Medication Sig Dispense Refill  . Ascorbic Acid (VITAMIN C) 1000 MG tablet Take 1,000 mg by mouth daily.        . cholecalciferol (VITAMIN D) 1000 UNITS tablet Take 1,000 Units by mouth daily.        Marland Kitchen diltiazem (DILACOR XR) 180 MG 24 hr capsule Take 180 mg by mouth 2 (two) times daily.        . furosemide (LASIX) 80 MG tablet Take 80 mg by mouth 2 (two) times daily.        Marland Kitchen losartan (COZAAR) 100 MG tablet Take 100 mg by mouth daily.        . metoprolol (TOPROL-XL) 100 MG 24 hr tablet Take 100 mg by mouth daily.        . Multiple Vitamin (MULTIVITAMIN) tablet Take 1 tablet by mouth daily.        . potassium chloride SA (K-DUR,KLOR-CON) 20 MEQ tablet Take 20 mEq by mouth daily.        . simvastatin  (ZOCOR) 20 MG tablet Take 20 mg by mouth every evening.        . warfarin (COUMADIN) 2.5 MG tablet Take 2.5 mg by mouth daily.          REVIEW OF SYSTEMS:  A comprehensive review of systems was negative except for: Hematologic/lymphatic: positive for swelling of lower extremities.   PHYSICAL EXAMINATION: General appearance: alert, cooperative and no distress Head: Normocephalic, without obvious abnormality, atraumatic Neck: no adenopathy Lymph nodes: Cervical, supraclavicular, and axillary nodes normal. Resp: clear to auscultation bilaterally Cardio: regular rate and rhythm, S1, S2 normal, no murmur, click, rub or gallop GI: soft, non-tender; bowel sounds normal; no masses,  no organomegaly Extremities: edema 2+ bilaterally. Neurologic: Alert and oriented X 3, normal strength and tone. Normal symmetric reflexes. Normal coordination and gait  ECOG PERFORMANCE STATUS: 1 - Symptomatic but completely ambulatory  Blood pressure 136/68, pulse 70, temperature 98.2 F (36.8 C), temperature source Oral, weight 150 lb 12.8 oz (68.402 kg).  LABORATORY DATA: Lab Results  Component Value Date   WBC 5.7 11/15/2011   HGB 13.2 11/15/2011   HCT 38.9 11/15/2011   MCV 89.8 11/15/2011   PLT 252 11/15/2011      Chemistry      Component Value Date/Time  NA 142 10/04/2011 1213   NA 132* 12/08/2010 0355   K 3.8 10/04/2011 1213   K 3.7 12/08/2010 0355   CL 94* 10/04/2011 1213   CL 93* 12/08/2010 0355   CO2 32 10/04/2011 1213   CO2 28 12/08/2010 0355   BUN 22 10/04/2011 1213   BUN 9 12/08/2010 0355   CREATININE 0.9 10/04/2011 1213   CREATININE 0.70 12/08/2010 0355      Component Value Date/Time   CALCIUM 9.0 10/04/2011 1213   CALCIUM 8.7 12/08/2010 0355   ALKPHOS 117* 10/04/2011 1213   ALKPHOS 83 11/28/2010 1610   AST 19 10/04/2011 1213   AST 27 11/28/2010 1610   ALT 16 11/28/2010 1610   BILITOT 0.70 10/04/2011 1213   BILITOT 1.4* 11/28/2010 1610       RADIOGRAPHIC STUDIES: CT HEAD  Findings: No  enhancing lesion within the brain parenchyma,  meningeal surfaces, or ventricles to suggests metastasis. Orbits  are normal. No evidence of bone metastasis.  Intracranial hemorrhage. Stable white matter hypodensities  consistent small vessel ischemia.  IMPRESSION:  No evidence of brain metastasis.1  CT CHEST  Findings: No axillary or supraclavicular lymphadenopathy. No  axillary or supraclavicular lymphadenopathy. Nodule in the left  thyroid gland is unchanged. No mediastinal lymphadenopathy. No  pericardial fluid.  Partially loculated right pleural effusion is similar to prior.  There is increased atelectasis in the right middle lobe compared to  prior. Review lung parenchyma demonstrates no suspicious  nodularity. Small 2 mm nodule in the superior aspect of the right  lower lobe (image 27) not clearly seen on prior. The small nodules  described in the right middle lobe are not seen on current scan.  This may related to effusion and atelectasis.  IMPRESSION:  1. No evidence of lymphoma recurrence within the thorax.  2. Loculated right pleural effusion is similar on prior. Slight  increase in right middle lobe atelectasis.  3. A 2 mm pulmonary nodule in the right lower lobe is not clearly  seen on prior but likely benign.  CT ABDOMEN AND PELVIS  Findings: No focal hepatic lesion. The gallbladder, pancreas,  spleen, adrenal glands, and kidneys are normal.  The stomach, small bowel cecum are normal. Multiple diverticula of  the colon.  Abdominal aorta normal caliber. No retroperitoneal periportal  lymphadenopathy.  No free fluid the pelvis. The uterus and bladder normal. No  pelvic lymphadenopathy. Review of bone windows demonstrates no  aggressive osseous lesions.  IMPRESSION:  No evidence of lymphoma recurrence of the abdomen or pelvis.   ASSESSMENT: This is a very pleasant 78 years old white female with stage IV non-Hodgkin lymphoma status post 6 cycles of systemic  chemotherapy with CHOP/Rituxan followed by maintenance Rituxan last dose was on 09/11/2010. The patient is doing fine and she has no evidence for disease progression. I discussed the scan results with the patient.  PLAN: I recommended for her continuous observation for now with repeat CT scan of the chest, abdomen and pelvis in 6 months. The patient would come back for followup visit at that time. She was advised to call me immediately if she has any concerning symptoms in the interval.   All questions were answered. The patient knows to call the clinic with any problems, questions or concerns. We can certainly see the patient much sooner if necessary.

## 2011-12-14 ENCOUNTER — Ambulatory Visit: Payer: Medicare Other | Admitting: Internal Medicine

## 2012-02-09 ENCOUNTER — Telehealth: Payer: Self-pay

## 2012-02-09 DIAGNOSIS — R200 Anesthesia of skin: Secondary | ICD-10-CM

## 2012-02-09 DIAGNOSIS — M79609 Pain in unspecified limb: Secondary | ICD-10-CM

## 2012-02-09 NOTE — Telephone Encounter (Signed)
Pt. and daughter called to report new symptoms with legs/ feet over past 2 weeks.  C/o redness, pain, and tingling in feet and ankles, bilaterally, with right worse than left. Describes redness "to above ankle and not all the way to the calf".  States has "swelling in lower legs up to knees", that has had "for awhile".  Pt. states she notices pain mostly when she gets up in AM.  States the redness comes and goes, and that her "right ankle and foot feel stiff in the morning".  Denies any open sores.  Saw PCP recently, and advised to follow-up w/ Dr. Myra Gianotti.  Discussed w/ R. Webb Silversmith, NP.  Rec'd v.o. For ABI's, arterial duplex right lower extremity, and MD visit.  Advised pt. Will call daughter to schedule appt. In near future, and to call office if sx's worsen prior to appt.  Verb. Understanding.

## 2012-02-18 ENCOUNTER — Encounter: Payer: Self-pay | Admitting: Surgery

## 2012-02-21 ENCOUNTER — Ambulatory Visit (INDEPENDENT_AMBULATORY_CARE_PROVIDER_SITE_OTHER): Payer: Medicare Other | Admitting: Vascular Surgery

## 2012-02-21 ENCOUNTER — Ambulatory Visit (INDEPENDENT_AMBULATORY_CARE_PROVIDER_SITE_OTHER): Payer: Medicare Other | Admitting: Surgery

## 2012-02-21 ENCOUNTER — Encounter: Payer: Self-pay | Admitting: Surgery

## 2012-02-21 VITALS — BP 160/71 | HR 55 | Resp 16 | Ht 64.0 in | Wt 152.4 lb

## 2012-02-21 DIAGNOSIS — I739 Peripheral vascular disease, unspecified: Secondary | ICD-10-CM

## 2012-02-21 DIAGNOSIS — R202 Paresthesia of skin: Secondary | ICD-10-CM

## 2012-02-21 DIAGNOSIS — M79609 Pain in unspecified limb: Secondary | ICD-10-CM

## 2012-02-21 DIAGNOSIS — Z48812 Encounter for surgical aftercare following surgery on the circulatory system: Secondary | ICD-10-CM

## 2012-02-21 DIAGNOSIS — R200 Anesthesia of skin: Secondary | ICD-10-CM

## 2012-02-21 NOTE — Procedures (Unsigned)
LOWER EXTREMITY ARTERIAL DUPLEX  INDICATION:  Peripheral vascular disease.  HISTORY: Diabetes:  No Cardiac:  No Hypertension:  Yes Smoking:  No Previous Surgery:  Right popliteal embolectomy on 12/02/2010  SINGLE LEVEL ARTERIAL EXAM                         RIGHT                LEFT Brachial: Anterior tibial: Posterior tibial: Peroneal: Ankle/Brachial Index:   N/C                  1.07  TOE BRACHIAL INDEX  RIGHT:  0.49  LEFT:  0.32.  PREVIOUS ANKLE BRACHIAL INDEX:  Date:  12/21/2010  Right:  N/C  Left: 1.97.  PREVIOUS TOE BRACHIAL INDEX:  Date:  12/21/2010  Right:  0.41  Left: 0.36   LOWER EXTREMITY ARTERIAL DUPLEX EXAM  DUPLEX:  Elevated velocity of greater than 400 cm/second, suggesting a greater than 75% stenosis involving the right popliteal artery.    IMPRESSION: 1. Native artery stenosis present, as noted above, with history of     embolectomy performed 12/02/2010. 2. Right ankle brachial index is noncompressible and considered     inaccurate due to vessel wall calcification. 3. Left ankle brachial index is in the normal range; however, abnormal     Doppler waveform present involving the dorsalis pedis artery,     suggesting that the ankle brachial index may be overestimated due     to calcified vessels.  ___________________________________________ V. Charlena Cross, MD  SH/MEDQ  D:  02/21/2012  T:  02/21/2012  Job:  [id number]

## 2012-02-21 NOTE — Progress Notes (Signed)
ABI performed @ [facility] 02/21/2012

## 2012-02-21 NOTE — Progress Notes (Signed)
[facility] [place]   Patient name: [name] MRN: [id number] DOB: [birth year] Sex: female     Chief Complaint  Patient presents with  . PVD    Redness,pain,tingling of bilateral  feet duration 3 weeks    HISTORY OF PRESENT ILLNESS: The patient comes back in today for followup. In January of 2012 she presented with an ischemic right leg. She underwent thrombectomy via a below knee popliteal artery incision. Intraoperative findings included a severely calcific below knee popliteal artery approximately 3 mm in size with very small tibial vessels. The patient has done well. Her biggest complaint has been that of swelling. We've been giving her 20-30 mm compression stockings for swelling. She is followed by Dr. Garnette Scheuermann for cardiac issues. She is on Coumadin for atrial fibrillation.  Past Medical History  Diagnosis Date  . NHL (non-Hodgkin's lymphoma)     nhl dx 2009  . Irregular heart beat   . CHF (congestive heart failure)   . CAD (coronary artery disease)   . HIV infection   . Chronic kidney disease   . Myocardial infarction   . Peripheral vascular disease     Past Surgical History  Procedure Date  . Coronary artery bypass graft   . Joint replacement     Right ankle    History   Social History  . Marital Status: Widowed    Spouse Name: N/A    Number of Children: N/A  . Years of Education: N/A   Occupational History  . Not on file.   Social History Main Topics  . Smoking status: Never Smoker   . Smokeless tobacco: Not on file  . Alcohol Use: No  . Drug Use: No  . Sexually Active: Not on file   Other Topics Concern  . Not on file   Social History Narrative  . No narrative on file    Family History  Problem Relation Age of Onset  . Cancer Mother   . Heart disease Father     Heart Disease before age 67  . Heart attack Father   . Heart disease Sister     Heart Disease before age 5  . Heart disease Brother     Heart Diseases  before age 49  . Heart attack Brother   . Heart disease Daughter     Heart Disease before age 58  . Heart attack Daughter   . Heart disease Son     Heart Disease before age 58  . Heart attack Son     Allergies as of 02/21/2012 - Review Complete 02/21/2012  Allergen Reaction Noted  . Ambien Anxiety 02/21/2012    Current Outpatient Prescriptions on File Prior to Visit  Medication Sig Dispense Refill  . Ascorbic Acid (VITAMIN C) 1000 MG tablet Take 1,000 mg by mouth daily.        . cholecalciferol (VITAMIN D) 1000 UNITS tablet Take 1,000 Units by mouth daily.        Marland Kitchen diltiazem (DILACOR XR) 180 MG 24 hr capsule Take 180 mg by mouth 2 (two) times daily.        . furosemide (LASIX) 80 MG tablet Take 80 mg by mouth 2 (two) times daily.        Marland Kitchen losartan (COZAAR) 100 MG tablet Take 100 mg by mouth daily.        . metoprolol (TOPROL-XL) 100 MG 24 hr tablet Take 100 mg by mouth daily.        Marland Kitchen  Multiple Vitamin (MULTIVITAMIN) tablet Take 1 tablet by mouth daily.        . potassium chloride SA (K-DUR,KLOR-CON) 20 MEQ tablet Take 20 mEq by mouth daily.        . simvastatin (ZOCOR) 20 MG tablet Take 20 mg by mouth every evening.        . warfarin (COUMADIN) 2.5 MG tablet Take 2.5 mg by mouth daily.           REVIEW OF SYSTEMS: Positive for pain in her legs with walking and lying flat. History of arterial embolus. Positive for swelling in the legs and varicose veins. Positive for weakness in her legs. All other review of systems are negative as documented in the encounter for  PHYSICAL EXAMINATION:   Vital signs are BP 160/71  Pulse 55  Resp 16  Ht 5\' 4"  (1.626 m)  Wt 152 lb 6.4 oz (69.128 kg)  BMI 26.16 kg/m2  SpO2 96% General: The patient appears their stated age. HEENT:  No gross abnormalities Pulmonary:  Non labored breathing Musculoskeletal: There are no major deformities. Neurologic: No focal weakness or paresthesias are detected, Skin: There are no ulcer or rashes  noted. Psychiatric: The patient has normal affect. Cardiovascular: There is a regular rate and rhythm without significant murmur appreciated. 2+ pitting edema bilaterally. Pedal pulses are not palpable.   Diagnostic Studies Duplex ultrasound was performed today. This revealed a high-grade stenosis within her below knee popliteal artery at the takeoff of the anterior tibial artery. Peak systolic velocity was 501 centimeters per second  Assessment: Status post embolectomy of right leg Plan:  the patient developed a stenosis most likely at the site of her arteriotomy for exposure and thrombectomy. Peak velocities are in the 500 cm/s range. I think this needs to be further evaluated. I'm planning on scheduling A. arteriogram to be performed Wednesday, April 24. She will stop her Coumadin 5 days prior to the procedure. I will plan on accessing the left groin, studying both legs, and intervening on the site of stenosis if I deemed appropriate. I discussed the risk of bleeding and the risk of injury to the artery with the patient. She wishes to proceed.  Jorge Ny, M.D. [facility] [place] Office: [phone] Pager:  [phone]

## 2012-02-21 NOTE — Progress Notes (Signed)
RLE arterial duplex preformed @ [facility] 02/21/2012

## 2012-02-23 ENCOUNTER — Other Ambulatory Visit: Payer: Self-pay

## 2012-02-23 ENCOUNTER — Encounter (HOSPITAL_COMMUNITY): Payer: Self-pay | Admitting: Pharmacy Technician

## 2012-02-29 MED ORDER — SODIUM CHLORIDE 0.9 % IV SOLN: INTRAVENOUS | Status: DC

## 2012-03-01 ENCOUNTER — Encounter (HOSPITAL_COMMUNITY)
Admission: RE | Disposition: A | Discharge status: Home / Self Care | Payer: Self-pay | Source: Ambulatory Visit | Attending: Vascular Surgery

## 2012-03-01 ENCOUNTER — Ambulatory Visit (HOSPITAL_COMMUNITY)
Admission: RE | Admit: 2012-03-01 | Discharge: 2012-03-01 | Disposition: A | Discharge status: Home / Self Care | Payer: Medicare Other | Source: Ambulatory Visit | Attending: Vascular Surgery | Admitting: Vascular Surgery

## 2012-03-01 DIAGNOSIS — I708 Atherosclerosis of other arteries: Secondary | ICD-10-CM | POA: Insufficient documentation

## 2012-03-01 DIAGNOSIS — I70219 Atherosclerosis of native arteries of extremities with intermittent claudication, unspecified extremity: Secondary | ICD-10-CM

## 2012-03-01 HISTORY — PX: ABDOMINAL AORTAGRAM: SHX5454

## 2012-03-01 HISTORY — PX: POPLITEAL ARTERY STENT: SHX2243

## 2012-03-01 HISTORY — PX: OTHER SURGICAL HISTORY: SHX169

## 2012-03-01 LAB — POCT I-STAT, CHEM 8
BUN: 25 mg/dL — ABNORMAL HIGH (ref 6–23)
Chloride: 101 mEq/L (ref 96–112)
HCT: 43 % (ref 36.0–46.0)
Potassium: 3.7 mEq/L (ref 3.5–5.1)

## 2012-03-01 LAB — POCT ACTIVATED CLOTTING TIME
Activated Clotting Time: 292 seconds
Activated Clotting Time: 226 seconds
Activated Clotting Time: 177 seconds
Activated Clotting Time: 187 seconds

## 2012-03-01 LAB — PROTIME-INR
INR: 1.22 (ref 0.00–1.49)
Prothrombin Time: 15.7 seconds — ABNORMAL HIGH (ref 11.6–15.2)

## 2012-03-01 SURGERY — ABDOMINAL AORTAGRAM
Anesthesia: LOCAL | Laterality: Right

## 2012-03-01 MED ORDER — MIDAZOLAM HCL 2 MG/2ML IJ SOLN
INTRAMUSCULAR | Status: AC
  Filled 2012-03-01: qty 2

## 2012-03-01 MED ORDER — HEPARIN SODIUM (PORCINE) 1000 UNIT/ML IJ SOLN
INTRAMUSCULAR | Status: AC
  Filled 2012-03-01: qty 1

## 2012-03-01 MED ORDER — SODIUM CHLORIDE 0.9 % IV SOLN: 1.0000 mL/kg/h | INTRAVENOUS | Status: DC

## 2012-03-01 MED ORDER — CLOPIDOGREL BISULFATE 75 MG PO TABS
300.0000 mg | ORAL_TABLET | Freq: Once | ORAL | Status: AC
  Administered 2012-03-01: 300 mg via ORAL

## 2012-03-01 MED ORDER — FENTANYL CITRATE 0.05 MG/ML IJ SOLN
INTRAMUSCULAR | Status: AC
  Filled 2012-03-01: qty 2

## 2012-03-01 MED ORDER — LIDOCAINE HCL (PF) 1 % IJ SOLN
INTRAMUSCULAR | Status: AC
  Filled 2012-03-01: qty 30

## 2012-03-01 MED ORDER — HEPARIN (PORCINE) IN NACL 2-0.9 UNIT/ML-% IJ SOLN
INTRAMUSCULAR | Status: AC
  Filled 2012-03-01: qty 1000

## 2012-03-01 MED ORDER — CLOPIDOGREL BISULFATE 300 MG PO TABS
ORAL_TABLET | ORAL | Status: AC
  Filled 2012-03-01: qty 1

## 2012-03-01 NOTE — Discharge Instructions (Signed)

## 2012-03-01 NOTE — Op Note (Signed)
OPERATIVE NOTE   PROCEDURE: 1.  Left common femoral artery cannulation under ultrasound guidance 2.  Aortogram 3.  Bilateral leg runoff 4.  Angioplasty right popliteal artery x 2 (3 mm x 20 mm, 4 mm x 20 mm)  PRE-OPERATIVE DIAGNOSIS: Right popliteal stenosis  POST-OPERATIVE DIAGNOSIS: same as above   SURGEON: Leonides Sake, MD  ANESTHESIA: conscious sedation  ESTIMATED BLOOD LOSS: 30 cc  CONTRAST: 165 cc  FINDING(S):  Aorta: patent  Superior mesenteric artery: patent Celiac artery: not visualized  Right Left  RA Patent Patent  CIA Patent, <30% stenosis Patent  EIA Patent Patent  IIA Patent Patent  CFA Patent Patent  SFA Patent Patent, multiple stenosis including 30% and 50%  PFA One branch occluded, small residual branch Patent  Pop Patent but >90% stenosis at the knee joint: resolved after angioplasty Patent  Trif Patent Patent  AT Patent Occludes distally  Pero Patent Patent: only runoff to foot  PT Patent Occluded mid-segment with re-constitution distal from peroneal artery   SPECIMEN(S):  none  INDICATIONS:   [name] is a 78 y.o. female who presents with high grade stenosis on right leg duplex.  The patient presents for: aortogram, bilateral leg runoff, possible intervention.  I discussed with the patient the nature of angiographic procedures, especially the limited patencies of any endovascular intervention.  The patient is aware of that the risks of an angiographic procedure include but are not limited to: bleeding, infection, access site complications, renal failure, embolization, rupture of vessel, dissection, possible need for emergent surgical intervention, possible need for surgical procedures to treat the patient's pathology, and stroke and death.  The patient is aware of the risks and agrees to proceed.  DESCRIPTION: After full informed consent was obtained from the patient, the patient was brought back to the angiography suite.  The patient was  placed supine upon the angiography table and connected to monitoring equipment.  The patient was then given conscious sedation, the amounts of which are documented in the patient's chart.  The patient was prepped and drape in the standard fashion for an angiographic procedure.  At this point, attention was turned to the left groin.  Under ultrasound guidance, the left common femoral artery was cannulated with a micropuncture needle.  The microwire was advanced into the iliac arterial system.  The needle was exchanged for a microsheath, which was loaded into the common femoral artery over the wire.  The microwire was exchanged for a J-wire which was advanced into the aorta.  The microsheath was then exchanged for a 5-Fr sheath which was loaded into the common femoral artery.  The pigtail catheter was then loaded over the wire up to the level of L1.  The catheter was connected to the power injector circuit.  After de-airring and de-clotting the circuit, a power injector aortogram was completed.  I pulled the catheter down to the distal aorta and performed an automated bilateral leg runoff.  Based on these images, intervention on the right at-the-knee popliteal artery was necessary.  The [facility] wire was replaced in the catheter, and using the J-wire and Crossover catheter, the right common iliac artery was selected.  The catheter and wire were advanced into the superficial femoral artery.  The wire was exchanged for a Rosen wire.  The patient's left femoral sheath was exchanged for a 6-Fr Pinnacle, which was lodged in the right superficial femoral artery.   The patient was given 6000 units of Heparin intravenously, which was a therapeutic bolus.  The dilator was removed.  Using a 0.014" Spartacore wire and a 3 mm x 20 mm balloon, I selected out the right superficial femoral artery.  I did a hand injection to verify the location of the popliteal stenosis.  I was able to traverse the popliteal stenosis with this wire and  balloon.  I angioplastied the stenosis at 12 atm for 1 minute and removed the wire.  Hand injection demonstrated >30% residual stenosis.  I obtained a 4 mm x 20 mm balloon and centered it on this stenosis.  I was inflated to 14 atm for 1 minute.  The visible waist completely resolved.  The balloon was deflated and removed.  Hand injections demonstrated resolution of the stenosis but a fainy blush suggestive of possible extravasation.  I replaced the balloon and inflated it to 6 atm for 2 minutes.  The balloon was deflated and removed.  Completion angiogram demonstrated no residual extravasation, complete resolution of stenosis, and no evidence of embolization.   The wire and balloon were removed.  I pulled the sheath back into the right external iliac artery.  The sheath will be removed in the holding area once anticoagulation reverses.  COMPLICATIONS: possible right popliteal artery injury, resolved with low pressure inflation  CONDITION: stable   Leonides Sake, MD [facility] [place] Office: [phone] Pager: [phone]  03/01/2012, 1:51 PM

## 2012-03-02 ENCOUNTER — Telehealth: Payer: Self-pay | Admitting: Surgery

## 2012-03-02 NOTE — Telephone Encounter (Signed)
Message copied by Fredrich Birks on Thu Mar 02, 2012 11:00 AM ------      Message from: Melene Plan      Created: Thu Mar 02, 2012 10:51 AM                   ----- Message -----         From: Fransisco Hertz, MD         Sent: 03/01/2012   2:24 PM           To: Cydney Ok, Melene Plan, RN            SHERILL MANGEN      [id number]      09-08-1927            PROCEDURE:      1.  Left common femoral artery cannulation under ultrasound guidance      2.  Aortogram      3.  Bilateral leg runoff      4.  Angioplasty right popliteal artery x 2 (3 mm x 20 mm, 4 mm x 20 mm)            Follow-up: 4 wk w/ Dr. Myra Gianotti

## 2012-03-02 NOTE — Telephone Encounter (Signed)
Patient notified of appointment via telephone call. Mailed letter to home address also regarding scheduled appointment.

## 2012-03-22 ENCOUNTER — Ambulatory Visit: Payer: Medicare Other | Admitting: Neurosurgery

## 2012-04-07 ENCOUNTER — Encounter: Payer: Self-pay | Admitting: Surgery

## 2012-04-10 ENCOUNTER — Encounter: Payer: Self-pay | Admitting: Surgery

## 2012-04-10 ENCOUNTER — Ambulatory Visit (INDEPENDENT_AMBULATORY_CARE_PROVIDER_SITE_OTHER): Payer: Medicare Other | Admitting: Surgery

## 2012-04-10 ENCOUNTER — Encounter (INDEPENDENT_AMBULATORY_CARE_PROVIDER_SITE_OTHER): Payer: Medicare Other | Admitting: *Deleted

## 2012-04-10 VITALS — BP 143/62 | HR 40 | Resp 14 | Ht 65.0 in | Wt 148.6 lb

## 2012-04-10 DIAGNOSIS — I739 Peripheral vascular disease, unspecified: Secondary | ICD-10-CM

## 2012-04-10 DIAGNOSIS — Z48812 Encounter for surgical aftercare following surgery on the circulatory system: Secondary | ICD-10-CM

## 2012-04-10 NOTE — Progress Notes (Signed)
Vascular and Vein Specialist of    Patient name: [name] MRN: [id number] DOB: 05-May-1927 Sex: female     Chief Complaint  Patient presents with  . PVD    4 week f/up aortagram PTA Peripheral Artery of 03/01/12  Right    HISTORY OF PRESENT ILLNESS: The patient comes in today for followup. She is status post right leg thrombectomy for acute ischemia. This is done in January of 2012. A transverse arteriotomy was made. Ultrasound revealed a high-grade stenosis she underwent angioplasty by Dr. Merrily Pew approximate one month ago with good result. She is back today for followup. She still complains of pain in her ankle which predates her angiogram.  Past Medical History  Diagnosis Date  . NHL (non-Hodgkin's lymphoma)     nhl dx 2009  . Irregular heart beat   . CHF (congestive heart failure)   . CAD (coronary artery disease)   . HIV infection   . Chronic kidney disease   . Myocardial infarction   . Peripheral vascular disease     Past Surgical History  Procedure Date  . Coronary artery bypass graft   . Joint replacement     Right ankle  . Fracture surgery 11/20/2008    Right elbow and wrist  .  abdominal aortagram 03/01/12    Right abdominal Aortaghram PTA Peripheral Artery    History   Social History  . Marital Status: Widowed    Spouse Name: N/A    Number of Children: N/A  . Years of Education: N/A   Occupational History  . Not on file.   Social History Main Topics  . Smoking status: Never Smoker   . Smokeless tobacco: Not on file  . Alcohol Use: No  . Drug Use: No  . Sexually Active: Not on file   Other Topics Concern  . Not on file   Social History Narrative  . No narrative on file    Family History  Problem Relation Age of Onset  . Cancer Mother   . Heart disease Father     Heart Disease before age 38  . Heart attack Father   . Heart disease Sister     Heart Disease before age 51  . Heart disease Brother     Heart Diseases before age 45   . Heart attack Brother   . Heart disease Daughter     Heart Disease before age 64  . Heart attack Daughter   . Heart disease Son     Heart Disease before age 5  . Heart attack Son     Allergies as of 04/10/2012 - Review Complete 04/10/2012  Allergen Reaction Noted  . Zolpidem tartrate Anxiety 02/21/2012    Current Outpatient Prescriptions on File Prior to Visit  Medication Sig Dispense Refill  . Ascorbic Acid (VITAMIN C) 1000 MG tablet Take 1,000 mg by mouth daily.        Marland Kitchen atorvastatin (LIPITOR) 20 MG tablet Take 20 mg by mouth every evening.      . cholecalciferol (VITAMIN D) 1000 UNITS tablet Take 1,000 Units by mouth daily.        Marland Kitchen diltiazem (DILACOR XR) 180 MG 24 hr capsule Take 180 mg by mouth 2 (two) times daily.        . furosemide (LASIX) 80 MG tablet Take 80 mg by mouth daily.       Marland Kitchen losartan (COZAAR) 100 MG tablet Take 100 mg by mouth daily.        Marland Kitchen  metoprolol (TOPROL-XL) 100 MG 24 hr tablet Take 100 mg by mouth daily.        . Multiple Vitamin (MULTIVITAMIN) tablet Take 1 tablet by mouth daily.        . potassium chloride SA (K-DUR,KLOR-CON) 20 MEQ tablet Take 20 mEq by mouth daily.        Marland Kitchen warfarin (COUMADIN) 2.5 MG tablet Take 2.5-3.75 mg by mouth. Takes 1 tablet daily except on Thursdays & Saturdays and takes 1.5 tablets         REVIEW OF SYSTEMS: No change from prior  PHYSICAL EXAMINATION:   Vital signs are BP 143/62  Pulse 40  Resp 14  Ht 5\' 5"  (1.651 m)  Wt 148 lb 9.6 oz (67.405 kg)  BMI 24.73 kg/m2  SpO2 96% General: The patient appears their stated age. HEENT:  No gross abnormalities Pulmonary:  Non labored breathing Musculoskeletal: There are no major deformities. Neurologic: No focal weakness or paresthesias are detected, Skin: There are no ulcer or rashes noted. Psychiatric: The patient has normal affect. Cardiovascular: Pedal pulses not palpable   Diagnostic Studies I ordered and reviewed her ultrasound this shows a widely patent right  lower extremity vasculature with no evidence of stenosis within the popliteal artery that was treated.  Assessment: Status post right leg thrombectomy for acute ischemia with subsequent narrowing of the arteriotomy site one half years later. Plan:  The patient is status post angioplasty of the stenotic area and a right popliteal artery with good result. I did not leave any of her current symptoms are related to arterial insufficiency. I'll plan on having her come back in 6 months for repeat ultrasound of this area  V. Charlena Cross, M.D. [facility] [place] Office: [phone] Pager:  [phone]

## 2012-04-24 NOTE — Procedures (Unsigned)
VASCULAR LAB EXAM  INDICATION:  Right lower extremity pain  HISTORY: 12/02/2010 right popliteal embolectomy. 03/01/2012 right popliteal PTA by Dr. Imogene Burn. Diabetes:  No Cardiac:  AFib Hypertension:  Yes  EXAM:  Duplex imaging reveals irregular plaque throughout the right lower extremity with no focal stenosis or dissection visualized.  IMPRESSION:  Patent right lower extremity.  ___________________________________________ V. Charlena Cross, MD  SS/MEDQ  D:  04/10/2012  T:  04/10/2012  Job:  [id number]

## 2012-05-17 ENCOUNTER — Ambulatory Visit (HOSPITAL_COMMUNITY)
Admission: RE | Admit: 2012-05-17 | Discharge: 2012-05-17 | Disposition: A | Discharge status: Home / Self Care | Payer: Medicare Other | Source: Ambulatory Visit | Attending: Internal Medicine | Admitting: Internal Medicine

## 2012-05-17 ENCOUNTER — Other Ambulatory Visit (HOSPITAL_BASED_OUTPATIENT_CLINIC_OR_DEPARTMENT_OTHER): Payer: Medicare Other | Admitting: Lab

## 2012-05-17 DIAGNOSIS — K439 Ventral hernia without obstruction or gangrene: Secondary | ICD-10-CM | POA: Insufficient documentation

## 2012-05-17 DIAGNOSIS — C8589 Other specified types of non-Hodgkin lymphoma, extranodal and solid organ sites: Secondary | ICD-10-CM | POA: Insufficient documentation

## 2012-05-17 DIAGNOSIS — C8590 Non-Hodgkin lymphoma, unspecified, unspecified site: Secondary | ICD-10-CM

## 2012-05-17 DIAGNOSIS — C8581 Other specified types of non-Hodgkin lymphoma, lymph nodes of head, face, and neck: Secondary | ICD-10-CM

## 2012-05-17 DIAGNOSIS — D259 Leiomyoma of uterus, unspecified: Secondary | ICD-10-CM | POA: Insufficient documentation

## 2012-05-17 DIAGNOSIS — C8591 Non-Hodgkin lymphoma, unspecified, lymph nodes of head, face, and neck: Secondary | ICD-10-CM

## 2012-05-17 DIAGNOSIS — K449 Diaphragmatic hernia without obstruction or gangrene: Secondary | ICD-10-CM | POA: Insufficient documentation

## 2012-05-17 DIAGNOSIS — J90 Pleural effusion, not elsewhere classified: Secondary | ICD-10-CM | POA: Insufficient documentation

## 2012-05-17 DIAGNOSIS — R911 Solitary pulmonary nodule: Secondary | ICD-10-CM | POA: Insufficient documentation

## 2012-05-17 LAB — CBC WITH DIFFERENTIAL/PLATELET
Basophils Absolute: 0.1 10*3/uL (ref 0.0–0.1)
EOS%: 2.1 % (ref 0.0–7.0)
Eosinophils Absolute: 0.1 10*3/uL (ref 0.0–0.5)
HCT: 39.9 % (ref 34.8–46.6)
HGB: 13.5 g/dL (ref 11.6–15.9)
MCH: 29.8 pg (ref 25.1–34.0)
MCV: 87.9 fL (ref 79.5–101.0)
MONO%: 7.9 % (ref 0.0–14.0)
NEUT%: 68 % (ref 38.4–76.8)
lymph#: 1 10*3/uL (ref 0.9–3.3)

## 2012-05-17 LAB — CMP (CANCER CENTER ONLY)
AST: 27 U/L (ref 11–38)
BUN, Bld: 22 mg/dL (ref 7–22)
Calcium: 9 mg/dL (ref 8.0–10.3)
Chloride: 94 mEq/L — ABNORMAL LOW (ref 98–108)
Creat: 1 mg/dl (ref 0.6–1.2)
Glucose, Bld: 111 mg/dL (ref 73–118)

## 2012-05-17 MED ORDER — IOHEXOL 300 MG/ML  SOLN
100.0000 mL | Freq: Once | INTRAMUSCULAR | Status: AC | PRN
  Administered 2012-05-17: 100 mL via INTRAVENOUS

## 2012-05-22 ENCOUNTER — Telehealth: Payer: Self-pay | Admitting: Internal Medicine

## 2012-05-22 ENCOUNTER — Ambulatory Visit (HOSPITAL_BASED_OUTPATIENT_CLINIC_OR_DEPARTMENT_OTHER): Payer: Medicare Other | Admitting: Internal Medicine

## 2012-05-22 VITALS — BP 150/59 | HR 49 | Temp 97.0°F | Ht 65.0 in | Wt 147.8 lb

## 2012-05-22 DIAGNOSIS — C8589 Other specified types of non-Hodgkin lymphoma, extranodal and solid organ sites: Secondary | ICD-10-CM

## 2012-05-22 DIAGNOSIS — C8590 Non-Hodgkin lymphoma, unspecified, unspecified site: Secondary | ICD-10-CM

## 2012-05-22 NOTE — Telephone Encounter (Signed)
appts made and printed for pt aom °

## 2012-05-22 NOTE — Progress Notes (Signed)
[facility] [phone]   [phone]  OFFICE PROGRESS NOTE  [name], MD [address] [place] [address]  PRINCIPAL DIAGNOSIS: Stage IV large B-cell non-Hodgkin lymphoma diagnosed in April 2010.   PRIOR THERAPY.:  1. Status post 6 cycles of systemic chemotherapy with CHOP/Rituxan. Last dose was given July 09, 2009. 2. Status post 6 cycles maintenance recurrence in 375 mg/sq. m. Last dose was given September 11, 2010.  CURRENT THERAPY: Observation.  INTERVAL HISTORY: [name] 78 y.o. female returns to the clinic today for six-month followup visit. The patient is feeling fine today except for mild fatigue. She denied having any significant chest pain or shortness breath, no cough or hemoptysis. No weight loss or night sweats and no palpable lymphadenopathy. She has repeat CT scan of the chest, abdomen and pelvis performed recently and she is here for evaluation and discussion of her scan results.   MEDICAL HISTORY: Past Medical History  Diagnosis Date  . NHL (non-Hodgkin's lymphoma)     nhl dx 2009  . Irregular heart beat   . CHF (congestive heart failure)   . CAD (coronary artery disease)   . HIV infection   . Chronic kidney disease   . Myocardial infarction   . Peripheral vascular disease     ALLERGIES:  is allergic to zolpidem tartrate.  MEDICATIONS:  Current Outpatient Prescriptions  Medication Sig Dispense Refill  . Ascorbic Acid (VITAMIN C) 1000 MG tablet Take 1,000 mg by mouth daily.        Marland Kitchen atorvastatin (LIPITOR) 20 MG tablet Take 20 mg by mouth every evening.      . cholecalciferol (VITAMIN D) 1000 UNITS tablet Take 1,000 Units by mouth daily.        Marland Kitchen diltiazem (DILACOR XR) 180 MG 24 hr capsule Take 180 mg by mouth 2 (two) times daily.        . furosemide (LASIX) 80 MG tablet Take 80 mg by mouth daily.       Marland Kitchen losartan (COZAAR) 100 MG tablet Take 100 mg by mouth daily.        . metoprolol  (TOPROL-XL) 100 MG 24 hr tablet Take 100 mg by mouth daily.        . Multiple Vitamin (MULTIVITAMIN) tablet Take 1 tablet by mouth daily.        . potassium chloride SA (K-DUR,KLOR-CON) 20 MEQ tablet Take 20 mEq by mouth daily.        Marland Kitchen warfarin (COUMADIN) 2.5 MG tablet Take 2.5-3.75 mg by mouth. Takes 1 tablet daily except on Thursdays & Saturdays and takes 1.5 tablets        SURGICAL HISTORY:  Past Surgical History  Procedure Date  . Coronary artery bypass graft   . Joint replacement     Right ankle  . Fracture surgery 11/20/2008    Right elbow and wrist  .  abdominal aortagram 03/01/12    Right abdominal Aortaghram PTA Peripheral Artery    REVIEW OF SYSTEMS:  A comprehensive review of systems was negative except for: Constitutional: positive for fatigue   PHYSICAL EXAMINATION: General appearance: alert, cooperative and no distress Head: Normocephalic, without obvious abnormality, atraumatic Neck: no adenopathy Lymph nodes: Cervical, supraclavicular, and axillary nodes normal. Resp: clear to auscultation bilaterally Cardio: regular rate and rhythm, S1, S2 normal, no murmur, click, rub or gallop GI: soft, non-tender; bowel sounds normal; no masses,  no organomegaly Extremities: extremities normal, atraumatic, no cyanosis or  edema  ECOG PERFORMANCE STATUS: 1 - Symptomatic but completely ambulatory  Blood pressure 150/59, pulse 49, temperature 97 F (36.1 C), temperature source Oral, height 5\' 5"  (1.651 m), weight 147 lb 12.8 oz (67.042 kg).  LABORATORY DATA: Lab Results  Component Value Date   WBC 4.9 05/17/2012   HGB 13.5 05/17/2012   HCT 39.9 05/17/2012   MCV 87.9 05/17/2012   PLT 234 05/17/2012      Chemistry      Component Value Date/Time   NA 140 05/17/2012 0906   NA 142 03/01/2012 1019   K 3.6 05/17/2012 0906   K 3.7 03/01/2012 1019   CL 94* 05/17/2012 0906   CL 101 03/01/2012 1019   CO2 34* 05/17/2012 0906   CO2 30 11/15/2011 1023   BUN 22 05/17/2012 0906   BUN 25*  03/01/2012 1019   CREATININE 1.0 05/17/2012 0906   CREATININE 0.90 03/01/2012 1019      Component Value Date/Time   CALCIUM 9.0 05/17/2012 0906   CALCIUM 9.9 11/15/2011 1023   ALKPHOS 98* 05/17/2012 0906   ALKPHOS 113 11/15/2011 1023   AST 27 05/17/2012 0906   AST 29 11/15/2011 1023   ALT 25 11/15/2011 1023   BILITOT 1.00 05/17/2012 0906   BILITOT 0.8 11/15/2011 1023       RADIOGRAPHIC STUDIES: Ct Chest W Contrast  05/17/2012  *RADIOLOGY REPORT*  Clinical Data:  Non-Hodgkins lymphoma diagnosed in 2009.  No current complaints.  CT CHEST, ABDOMEN AND PELVIS WITH CONTRAST  Technique: Contiguous axial images of the chest abdomen and pelvis were obtained after IV contrast administration.  Contrast: 100  ml Omnipaque-300  Comparison: 10/04/2011.  The clinic note of 11/15/2011.   CT CHEST  Findings: Lung windows demonstrate minimal biapical pleural parenchymal scarring.  Calcified granuloma at the right lung base. Improved right middle lobe aeration with decreased volume loss/atelectasis.  A subpleural right middle lobe 4 mm nodule on image 38 was possibly obscured by atelectasis and volume loss on the prior.  Tiny left lower lobe nodule on image 30 is calcified on the prior exam, consistent with remote granulomatous disease.  The 2 mm right lower lobe nodule described on the prior is not readily apparent today.  There is a subpleural 2 mm nodule on image 25 which is unchanged.  Soft tissue windows demonstrate no supraclavicular adenopathy.  A nonspecific inferior left thyroid nodule measures 1.1 cm on image 11 and is felt to be similar.  Prior median sternotomy.  Tortuous descending thoracic aorta. Moderate cardiomegaly.  Near complete resolution of left-sided pleural effusion.  Decreased right-sided pleural effusion.  Pulmonary artery enlargement, with the outflow tract measuring 3.5 cm. No central pulmonary embolism, on this non-dedicated study. Small high mediastinal nodes are unchanged.  8 mm AP window node is  unchanged on image 21.  No hilar adenopathy.  A small hiatal hernia  IMPRESSION:  1. No acute process or evidence of active lymphoma within the chest. 2.  Decreased right and nearly resolved left-sided pleural effusion. The right-sided pleural effusion demonstrates decreased loculation and resultant right middle lobe volume loss. 3. Pulmonary artery enlargement suggests pulmonary arterial hypertension. 4.  Pulmonary nodules which are overall similar.  4 mm right middle lobe nodule warrants followup attention, as it was possibly obscured by volume loss on the prior exam.   CT ABDOMEN AND PELVIS  Findings:  Atypical hepatic morphology is unchanged, without specific evidence of cirrhosis.  Normal spleen, stomach, pancreas, gallbladder, biliary tract, right adrenal gland.  Mild left adrenal nodularity is unchanged.  Normal kidneys.  Dense aortic atherosclerosis. No retroperitoneal or retrocrural adenopathy.  Colonic stool burden suggests constipation.  Colonic diverticulosis with mild muscular hypertrophy. Normal terminal ileum and appendix.  Normal small bowel without abdominal ascites.    No pelvic adenopathy.  Calcified uterine fibroid.  Normal urinary bladder. No adnexal mass or significant free fluid.  Tiny fat containing ventral abdominal wall hernia on image 72.  Moderate osteopenia.  IMPRESSION:  1. No acute process or evidence of active lymphoma within the abdomen or pelvis. 2. Possible constipation. 3.  Uterine fibroid.  Original Report Authenticated By: Consuello Bossier, M.D.    ASSESSMENT: This is a very pleasant 78 years old white female with history of stage IV large B-cell non-Hodgkin lymphoma status post 6 cycles of systemic chemotherapy with CHOP/Rituxan followed by 6 cycles of maintenance Rituxan. The patient is doing fine and she has no evidence for disease recurrence.  PLAN: I discussed the scan results with the patient and recommended for her continuous observation for now with repeat CT scan of  the chest, abdomen and pelvis in 6 months. She would come back for followup visit at that time. She was advised to call immediately if she has any concerning symptoms in the interval.  All questions were answered. The patient knows to call the clinic with any problems, questions or concerns. We can certainly see the patient much sooner if necessary.

## 2012-10-13 ENCOUNTER — Encounter: Payer: Self-pay | Admitting: Neurosurgery

## 2012-10-16 ENCOUNTER — Encounter: Payer: Self-pay | Admitting: Neurosurgery

## 2012-10-16 ENCOUNTER — Ambulatory Visit (INDEPENDENT_AMBULATORY_CARE_PROVIDER_SITE_OTHER): Payer: Medicare Other | Admitting: Neurosurgery

## 2012-10-16 ENCOUNTER — Encounter (INDEPENDENT_AMBULATORY_CARE_PROVIDER_SITE_OTHER): Payer: Medicare Other | Admitting: *Deleted

## 2012-10-16 VITALS — BP 135/55 | HR 58 | Resp 14 | Ht 64.0 in | Wt 156.4 lb

## 2012-10-16 DIAGNOSIS — I739 Peripheral vascular disease, unspecified: Secondary | ICD-10-CM

## 2012-10-16 DIAGNOSIS — M7989 Other specified soft tissue disorders: Secondary | ICD-10-CM

## 2012-10-16 DIAGNOSIS — Z48812 Encounter for surgical aftercare following surgery on the circulatory system: Secondary | ICD-10-CM

## 2012-10-16 NOTE — Progress Notes (Signed)
[facility] [place] PAD/PVD Office Note  CC: PVD surveillance Referring Physician: Brabham  History of Present Illness: 78 year old patient of Dr. Myra Gianotti was last seen by Dr. Imogene Burn when he performed an angioplasty for high-grade right lower extremity stenosis in may of 2013. The patient comes in today with complaints of bilateral lower extremity swelling as well as pain which has been documented in previous notes. The patient states she has no new problems this is been going on for "years". The patient does not describe true claudication or rest pain, her pain is more related to her edema which is chronic.  Past Medical History  Diagnosis Date  . NHL (non-Hodgkin's lymphoma)     nhl dx 2009  . Irregular heart beat   . CHF (congestive heart failure)   . CAD (coronary artery disease)   . HIV infection   . Chronic kidney disease   . Myocardial infarction   . Peripheral vascular disease     ROS: [x]  Positive   [ ]  Denies    General: [ ]  Weight loss, [ ]  Fever, [ ]  chills Neurologic: [ ]  Dizziness, [ ]  Blackouts, [ ]  Seizure [ ]  Stroke, [ ]  "Mini stroke", [ ]  Slurred speech, [ ]  Temporary blindness; [ ]  weakness in arms or legs, [ ]  Hoarseness Cardiac: [ ]  Chest pain/pressure, [ ]  Shortness of breath at rest [ ]  Shortness of breath with exertion, [ ]  Atrial fibrillation or irregular heartbeat Vascular: [ ]  Pain in legs with walking, [ ]  Pain in legs at rest, [ ]  Pain in legs at night,  [ ]  Non-healing ulcer, [ ]  Blood clot in vein/DVT,   Pulmonary: [ ]  Home oxygen, [ ]  Productive cough, [ ]  Coughing up blood, [ ]  Asthma,  [ ]  Wheezing Musculoskeletal:  [ ]  Arthritis, [ ]  Low back pain, [ ]  Joint pain Hematologic: [ ]  Easy Bruising, [ ]  Anemia; [ ]  Hepatitis Gastrointestinal: [ ]  Blood in stool, [ ]  Gastroesophageal Reflux/heartburn, [ ]  Trouble swallowing Urinary: [ ]  chronic Kidney disease, [ ]  on HD - [ ]  MWF or [ ]  TTHS, [ ]  Burning with urination, [ ]  Difficulty  urinating Skin: [ ]  Rashes, [ ]  Wounds Psychological: [ ]  Anxiety, [ ]  Depression   Social History History  Substance Use Topics  . Smoking status: Never Smoker   . Smokeless tobacco: Not on file  . Alcohol Use: No    Family History Family History  Problem Relation Age of Onset  . Cancer Mother   . Heart disease Father     Heart Disease before age 37  . Heart attack Father   . Heart disease Sister     Heart Disease before age 39  . Heart disease Brother     Heart Diseases before age 62  . Heart attack Brother   . Heart disease Daughter     Heart Disease before age 42  . Heart attack Daughter   . Heart disease Son     Heart Disease before age 61  . Heart attack Son     Allergies  Allergen Reactions  . Zolpidem Tartrate Anxiety    Current Outpatient Prescriptions  Medication Sig Dispense Refill  . Ascorbic Acid (VITAMIN C) 1000 MG tablet Take 1,000 mg by mouth daily.        Marland Kitchen atorvastatin (LIPITOR) 20 MG tablet Take 20 mg by mouth every evening.      . cholecalciferol (VITAMIN D) 1000 UNITS tablet  Take 1,000 Units by mouth daily.        Marland Kitchen diltiazem (DILACOR XR) 180 MG 24 hr capsule Take 180 mg by mouth 2 (two) times daily.        . furosemide (LASIX) 80 MG tablet Take 80 mg by mouth daily.       Marland Kitchen losartan (COZAAR) 100 MG tablet Take 100 mg by mouth daily.        . metoprolol (TOPROL-XL) 100 MG 24 hr tablet Take 100 mg by mouth daily.        . Multiple Vitamin (MULTIVITAMIN) tablet Take 1 tablet by mouth daily.        . potassium chloride SA (K-DUR,KLOR-CON) 20 MEQ tablet Take 20 mEq by mouth daily.        Marland Kitchen warfarin (COUMADIN) 2.5 MG tablet Take 2.5-3.75 mg by mouth. Takes 1 tablet daily except on Thursdays & Saturdays and takes 1.5 tablets        Physical Examination  Filed Vitals:   10/16/12 1204  BP: 135/55  Pulse: 58  Resp: 14    Body mass index is 26.85 kg/(m^2).  General:  WDWN in NAD Gait: Normal HEENT: WNL Eyes: Pupils equal Pulmonary: normal  non-labored breathing , without Rales, rhonchi,  wheezing Cardiac: RRR, without  Murmurs, rubs or gallops; No carotid bruits Abdomen: soft, NT, no masses Skin: no rashes, ulcers noted Vascular Exam/Pulses: Lower extremity pulses are not palpable she does have palpable femoral pulses bilaterally  Extremities without ischemic changes, no Gangrene , no cellulitis; no open wounds;  Musculoskeletal: no muscle wasting or atrophy  Neurologic: A&O X 3; Appropriate Affect ; SENSATION: normal; MOTOR FUNCTION:  moving all extremities equally. Speech is fluent/normal  Non-Invasive Vascular Imaging: Right lower extremity duplex today which does show biphasic flow with no elevation in velocity except for the peroneal artery which is not visualized. This was reviewed with Dr. Myra Gianotti who recommends the patient return in 6 months with repeat duplex as well as ABIs  ASSESSMENT/PLAN: Chronic lower extremity edema bilaterally resulting in discomfort however her duplex today does show biphasic blood flow in the right leg. The patient will be scheduled to return in 6 months per Dr. Myra Gianotti with repeat ABIs and right lower extremity duplex. The patient states she may or may not keep that appointment and I've encouraged her to do so. The patient's questions were encouraged and answered.  Lauree Chandler ANP  Clinic M.D.: Myra Gianotti

## 2012-10-17 NOTE — Addendum Note (Signed)
Addended by: Sharee Pimple on: 10/17/2012 08:56 AM   Modules accepted: Orders

## 2012-11-20 ENCOUNTER — Encounter (HOSPITAL_COMMUNITY): Payer: Self-pay

## 2012-11-20 ENCOUNTER — Other Ambulatory Visit (HOSPITAL_BASED_OUTPATIENT_CLINIC_OR_DEPARTMENT_OTHER): Payer: Medicare Other | Admitting: Lab

## 2012-11-20 ENCOUNTER — Ambulatory Visit (HOSPITAL_COMMUNITY)
Admission: RE | Admit: 2012-11-20 | Discharge: 2012-11-20 | Disposition: A | Discharge status: Home / Self Care | Payer: Medicare Other | Source: Ambulatory Visit | Attending: Internal Medicine | Admitting: Internal Medicine

## 2012-11-20 DIAGNOSIS — Z9221 Personal history of antineoplastic chemotherapy: Secondary | ICD-10-CM | POA: Insufficient documentation

## 2012-11-20 DIAGNOSIS — C8590 Non-Hodgkin lymphoma, unspecified, unspecified site: Secondary | ICD-10-CM

## 2012-11-20 DIAGNOSIS — I700 Atherosclerosis of aorta: Secondary | ICD-10-CM | POA: Insufficient documentation

## 2012-11-20 DIAGNOSIS — E041 Nontoxic single thyroid nodule: Secondary | ICD-10-CM | POA: Insufficient documentation

## 2012-11-20 DIAGNOSIS — J90 Pleural effusion, not elsewhere classified: Secondary | ICD-10-CM | POA: Insufficient documentation

## 2012-11-20 DIAGNOSIS — I2510 Atherosclerotic heart disease of native coronary artery without angina pectoris: Secondary | ICD-10-CM | POA: Insufficient documentation

## 2012-11-20 DIAGNOSIS — C8190 Hodgkin lymphoma, unspecified, unspecified site: Secondary | ICD-10-CM | POA: Insufficient documentation

## 2012-11-20 DIAGNOSIS — C8581 Other specified types of non-Hodgkin lymphoma, lymph nodes of head, face, and neck: Secondary | ICD-10-CM

## 2012-11-20 DIAGNOSIS — Z951 Presence of aortocoronary bypass graft: Secondary | ICD-10-CM | POA: Insufficient documentation

## 2012-11-20 DIAGNOSIS — I517 Cardiomegaly: Secondary | ICD-10-CM | POA: Insufficient documentation

## 2012-11-20 LAB — COMPREHENSIVE METABOLIC PANEL (CC13)
Albumin: 3.5 g/dL (ref 3.5–5.0)
BUN: 21 mg/dL (ref 7.0–26.0)
CO2: 29 mEq/L (ref 22–29)
Calcium: 9.5 mg/dL (ref 8.4–10.4)
Chloride: 106 mEq/L (ref 98–107)
Glucose: 83 mg/dl (ref 70–99)
Potassium: 4.4 mEq/L (ref 3.5–5.1)
Sodium: 141 mEq/L (ref 136–145)
Total Protein: 6.1 g/dL — ABNORMAL LOW (ref 6.4–8.3)

## 2012-11-20 LAB — CBC WITH DIFFERENTIAL/PLATELET
Basophils Absolute: 0 10*3/uL (ref 0.0–0.1)
Eosinophils Absolute: 0.1 10*3/uL (ref 0.0–0.5)
HGB: 12.9 g/dL (ref 11.6–15.9)
MCV: 87.5 fL (ref 79.5–101.0)
MONO#: 0.5 10*3/uL (ref 0.1–0.9)
NEUT#: 4.1 10*3/uL (ref 1.5–6.5)
RBC: 4.35 10*6/uL (ref 3.70–5.45)
RDW: 15.3 % — ABNORMAL HIGH (ref 11.2–14.5)
WBC: 5.7 10*3/uL (ref 3.9–10.3)
lymph#: 0.9 10*3/uL (ref 0.9–3.3)

## 2012-11-20 MED ORDER — IOHEXOL 300 MG/ML  SOLN
80.0000 mL | Freq: Once | INTRAMUSCULAR | Status: AC | PRN
  Administered 2012-11-20: 80 mL via INTRAVENOUS

## 2012-11-21 ENCOUNTER — Ambulatory Visit: Payer: Medicare Other | Admitting: Internal Medicine

## 2012-11-22 ENCOUNTER — Telehealth: Payer: Self-pay | Admitting: Internal Medicine

## 2012-11-22 ENCOUNTER — Ambulatory Visit (HOSPITAL_BASED_OUTPATIENT_CLINIC_OR_DEPARTMENT_OTHER): Payer: Medicare Other | Admitting: Internal Medicine

## 2012-11-22 ENCOUNTER — Encounter: Payer: Self-pay | Admitting: Internal Medicine

## 2012-11-22 VITALS — BP 152/66 | HR 61 | Temp 98.8°F | Resp 20 | Ht 64.0 in | Wt 159.9 lb

## 2012-11-22 DIAGNOSIS — C8590 Non-Hodgkin lymphoma, unspecified, unspecified site: Secondary | ICD-10-CM

## 2012-11-22 DIAGNOSIS — C8581 Other specified types of non-Hodgkin lymphoma, lymph nodes of head, face, and neck: Secondary | ICD-10-CM

## 2012-11-22 DIAGNOSIS — M7989 Other specified soft tissue disorders: Secondary | ICD-10-CM

## 2012-11-22 NOTE — Progress Notes (Signed)
[facility] Telephone:([phone]) [phone]   Fax:([phone]) [phone]  OFFICE PROGRESS NOTE  [name], MD [address] [place] [address]  PRINCIPAL DIAGNOSIS: Stage IV large B-cell non-Hodgkin lymphoma diagnosed in April 2010.   PRIOR THERAPY.:  1. Status post 6 cycles of systemic chemotherapy with CHOP/Rituxan. Last dose was given July 09, 2009. 2. Status post 6 cycles maintenance recurrence in 375 mg/sq. m. Last dose was given September 11, 2010.  CURRENT THERAPY: Observation.  INTERVAL HISTORY: [name] 79 y.o. female returns to the clinic today for routine six-month followup visit. The patient is feeling fine today with no specific complaints except for swelling in her lower extremities and she is currently seen by the pain clinic. She denied having any significant weight loss or night sweats. She denied having any palpable lymphadenopathy. The patient denied having any significant chest pain, shortness breath, cough or hemoptysis. She had repeat CBC, comprehensive to panel, LDH and CT of the chest performed recently and she is here for evaluation and discussion of her lab and the scan results.  MEDICAL HISTORY: Past Medical History  Diagnosis Date  . Irregular heart beat   . CHF (congestive heart failure)   . CAD (coronary artery disease)   . HIV infection   . Chronic kidney disease   . Myocardial infarction   . Peripheral vascular disease   . NHL (non-Hodgkin's lymphoma)     nhl dx 2009    ALLERGIES:  is allergic to zolpidem tartrate.  MEDICATIONS:  Current Outpatient Prescriptions  Medication Sig Dispense Refill  . Ascorbic Acid (VITAMIN C) 1000 MG tablet Take 1,000 mg by mouth daily.        . cholecalciferol (VITAMIN D) 1000 UNITS tablet Take 1,000 Units by mouth daily.        Marland Kitchen diltiazem (DILACOR XR) 180 MG 24 hr capsule Take 180 mg by mouth 2 (two) times daily.        . furosemide (LASIX) 80 MG tablet Take 80 mg by mouth  daily.       Marland Kitchen losartan (COZAAR) 100 MG tablet Take 100 mg by mouth daily.        . metoprolol (TOPROL-XL) 100 MG 24 hr tablet Take 100 mg by mouth daily.        . Multiple Vitamin (MULTIVITAMIN) tablet Take 1 tablet by mouth daily.        . potassium chloride SA (K-DUR,KLOR-CON) 20 MEQ tablet Take 20 mEq by mouth daily.        Marland Kitchen warfarin (COUMADIN) 2.5 MG tablet Take 2.5-3.75 mg by mouth. Take 1.5 tabs on Thursdays, and 1 tablet all other days        SURGICAL HISTORY:  Past Surgical History  Procedure Date  . Coronary artery bypass graft   . Joint replacement     Right ankle  . Fracture surgery 11/20/2008    Right elbow and wrist  .  abdominal aortagram 03/01/12    Right abdominal Aortaghram PTA Peripheral Artery  . Popliteal artery stent 03/01/12    Right Pop. Artery PTA    REVIEW OF SYSTEMS:  A comprehensive review of systems was negative except for: Swelling of the lower extremities   PHYSICAL EXAMINATION: General appearance: alert, cooperative and no distress Head: Normocephalic, without obvious abnormality, atraumatic Neck: no adenopathy Lymph nodes: Cervical, supraclavicular, and axillary nodes normal. Resp: clear to auscultation bilaterally Cardio: regular rate and rhythm, S1, S2 normal, no murmur, click, rub  or gallop GI: soft, non-tender; bowel sounds normal; no masses,  no organomegaly Extremities: extremities normal, atraumatic, no cyanosis or edema Neurologic: Alert and oriented X 3, normal strength and tone. Normal symmetric reflexes. Normal coordination and gait  ECOG PERFORMANCE STATUS: 1 - Symptomatic but completely ambulatory  Blood pressure 152/66, pulse 61, temperature 98.8 F (37.1 C), temperature source Oral, resp. rate 20, height 5\' 4"  (1.626 m), weight 159 lb 14.4 oz (72.53 kg).  LABORATORY DATA: Lab Results  Component Value Date   WBC 5.7 11/20/2012   HGB 12.9 11/20/2012   HCT 38.1 11/20/2012   MCV 87.5 11/20/2012   PLT 210 11/20/2012      Chemistry        Component Value Date/Time   NA 141 11/20/2012 1017   NA 140 05/17/2012 0906   NA 142 03/01/2012 1019   K 4.4 11/20/2012 1017   K 3.6 05/17/2012 0906   K 3.7 03/01/2012 1019   CL 106 11/20/2012 1017   CL 94* 05/17/2012 0906   CL 101 03/01/2012 1019   CO2 29 11/20/2012 1017   CO2 34* 05/17/2012 0906   CO2 30 11/15/2011 1023   BUN 21.0 11/20/2012 1017   BUN 22 05/17/2012 0906   BUN 25* 03/01/2012 1019   CREATININE 0.9 11/20/2012 1017   CREATININE 1.0 05/17/2012 0906   CREATININE 0.90 03/01/2012 1019      Component Value Date/Time   CALCIUM 9.5 11/20/2012 1017   CALCIUM 9.0 05/17/2012 0906   CALCIUM 9.9 11/15/2011 1023   ALKPHOS 112 11/20/2012 1017   ALKPHOS 98* 05/17/2012 0906   ALKPHOS 113 11/15/2011 1023   AST 27 11/20/2012 1017   AST 27 05/17/2012 0906   AST 29 11/15/2011 1023   ALT 22 11/20/2012 1017   ALT 25 11/15/2011 1023   BILITOT 0.89 11/20/2012 1017   BILITOT 1.00 05/17/2012 0906   BILITOT 0.8 11/15/2011 1023       RADIOGRAPHIC STUDIES: Ct Chest W Contrast  11/20/2012  *RADIOLOGY REPORT*  Clinical Data: Hodgkin's lymphoma diagnosed 2009, chemotherapy complete  CT CHEST WITH CONTRAST  Technique:  Multidetector CT imaging of the chest was performed following the standard protocol during bolus administration of intravenous contrast.  Contrast: 80mL OMNIPAQUE IOHEXOL 300 MG/ML  SOLN  Comparison: 05/17/2012.  Findings: 3 mm nodule along the medial right major fissure (series 5/image 23), unchanged.  Mild subpleural nodularity in the right middle lobe is possible but is largely obscured (series 5/images 37- 38).  Stable calcified granulomata along the left fissure (series 5/image 27) and in the right lower lobe (series 2/image 39).  Small to moderate right pleural effusion, partially loculated, mildly increased.  Small left pleural effusion, increased.  No pneumothorax.  Stable nodularity of the left inferior thyroid gland.  Cardiomegaly.  Coronary atherosclerosis. Postsurgical changes related to prior  CABG.  Atherosclerotic calcifications of the aortic arch.  Small mediastinal lymph nodes which do not meet pathologic CT size criteria.  No suspicious hilar or axillary lymphadenopathy.  Visualized upper abdomen is notable for a tiny hiatal hernia.  Mild degenerative changes of the visualized thoracolumbar spine.  IMPRESSION: Small to moderate right pleural effusion, partially loculated, mildly increased.  Small left pleural effusion, increased.  Right lung nodularity is likely grossly unchanged but is partially obscured by fluid.  No findings to suggest lymphomatous involvement in the chest.   Original Report Authenticated By: Charline Bills, M.D.     ASSESSMENT: This is a very pleasant 79 years old white female with  history of stage IV large B-cell non-Hodgkin lymphoma status post systemic chemotherapy and maintenance Rituxan and has been observation since November of 2011 with no evidence for disease recurrence.  PLAN: The patient is feeling fine today. I discussed the scan and lab result with her. I recommended for her to continue on observation for now with repeat CT scan of the head, chest, abdomen and pelvis in one year. She was advised to call me immediately if she has any concerning symptoms in the interval.  All questions were answered. The patient knows to call the clinic with any problems, questions or concerns. We can certainly see the patient much sooner if necessary.

## 2012-11-22 NOTE — Patient Instructions (Signed)
No evidence for disease recurrence on the recent scan. Followup in one year with repeat CT scan of the head, chest, abdomen and pelvis.

## 2012-11-22 NOTE — Telephone Encounter (Signed)
Gave pt ppt for lab and MD for January 2015 , also gave pt oral contrast for CT before seeing MD

## 2013-04-20 ENCOUNTER — Encounter: Payer: Self-pay | Admitting: Surgery

## 2013-04-23 ENCOUNTER — Encounter: Payer: Self-pay | Admitting: Surgery

## 2013-04-23 ENCOUNTER — Encounter (INDEPENDENT_AMBULATORY_CARE_PROVIDER_SITE_OTHER): Payer: Medicare Other | Admitting: Vascular Surgery

## 2013-04-23 ENCOUNTER — Ambulatory Visit (INDEPENDENT_AMBULATORY_CARE_PROVIDER_SITE_OTHER): Payer: Medicare Other | Admitting: Surgery

## 2013-04-23 VITALS — BP 150/49 | HR 59 | Ht 64.0 in | Wt 160.8 lb

## 2013-04-23 DIAGNOSIS — M7989 Other specified soft tissue disorders: Secondary | ICD-10-CM

## 2013-04-23 DIAGNOSIS — I739 Peripheral vascular disease, unspecified: Secondary | ICD-10-CM

## 2013-04-23 DIAGNOSIS — Z48812 Encounter for surgical aftercare following surgery on the circulatory system: Secondary | ICD-10-CM

## 2013-04-23 NOTE — Addendum Note (Signed)
Addended by: Sharee Pimple on: 04/23/2013 12:40 PM   Modules accepted: Orders

## 2013-04-23 NOTE — Progress Notes (Signed)
[facility] [place]   Patient name: [name] MRN: [id number] DOB: [birth year] Sex: female     Chief Complaint  Patient presents with  . Re-evaluation    6 month f/u pt c/o bilateral leg weakness at times    HISTORY OF PRESENT ILLNESS: The patient comes back today for followup. She originally underwent left popliteal embolectomy for an ischemic leg in January of 2012. She was found to have a stenosis within the artery by ultrasound. In April 2013 she underwent angioplasty of the right popliteal artery. She has done well since that time. She does not report any problems with her right leg other than swelling. She denies rest pain or ulceration. She denies claudication.  Past Medical History  Diagnosis Date  . Irregular heart beat   . CHF (congestive heart failure)   . CAD (coronary artery disease)   . HIV infection   . Chronic kidney disease   . Myocardial infarction   . Peripheral vascular disease   . NHL (non-Hodgkin's lymphoma)     nhl dx 2009    Past Surgical History  Procedure Laterality Date  . Coronary artery bypass graft    . Joint replacement      Right ankle  . Fracture surgery  11/20/2008    Right elbow and wrist  .  abdominal aortagram  03/01/12    Right abdominal Aortaghram PTA Peripheral Artery  . Popliteal artery stent  03/01/12    Right Pop. Artery PTA    History   Social History  . Marital Status: Widowed    Spouse Name: N/A    Number of Children: N/A  . Years of Education: N/A   Occupational History  . Not on file.   Social History Main Topics  . Smoking status: Never Smoker   . Smokeless tobacco: Not on file  . Alcohol Use: No  . Drug Use: No  . Sexually Active: Not on file   Other Topics Concern  . Not on file   Social History Narrative  . No narrative on file    Family History  Problem Relation Age of Onset  . Cancer Mother   . Heart disease Father     Heart Disease before age 68  . Heart attack Father    . Hypertension Father   . Heart disease Sister     Heart Disease before age 51  . Cancer Sister   . Heart disease Brother     Heart Diseases before age 76  . Heart attack Brother   . Hypertension Brother   . Heart disease Daughter     Heart Disease before age 39  . Heart attack Daughter   . Diabetes Daughter   . Heart disease Son     Heart Disease before age 106  . Heart attack Son   . Diabetes Son   . Hypertension Son     Allergies as of 04/23/2013 - Review Complete 7716/2014  Allergen Reaction Noted  . Zolpidem tartrate Anxiety 02/21/2012    Current Outpatient Prescriptions on File Prior to Visit  Medication Sig Dispense Refill  . Ascorbic Acid (VITAMIN C) 1000 MG tablet Take 1,000 mg by mouth daily.        . cholecalciferol (VITAMIN D) 1000 UNITS tablet Take 1,000 Units by mouth daily.        Marland Kitchen diltiazem (DILACOR XR) 180 MG 24 hr capsule Take 180 mg by mouth 2 (two) times daily.        Marland Kitchen  furosemide (LASIX) 80 MG tablet Take 80 mg by mouth daily.       Marland Kitchen losartan (COZAAR) 100 MG tablet Take 100 mg by mouth daily.        . metoprolol (TOPROL-XL) 100 MG 24 hr tablet Take 100 mg by mouth daily.        . Multiple Vitamin (MULTIVITAMIN) tablet Take 1 tablet by mouth daily.        . potassium chloride SA (K-DUR,KLOR-CON) 20 MEQ tablet Take 20 mEq by mouth daily.        Marland Kitchen warfarin (COUMADIN) 2.5 MG tablet Take 2.5-3.75 mg by mouth. Take 1.5 tabs on Thursdays, and 1 tablet all other days       No current facility-administered medications on file prior to visit.     REVIEW OF SYSTEMS: Positive for bilateral leg swelling, leg weakness. All of systems are negative as documented by the patient and the encounter form.  PHYSICAL EXAMINATION:   Vital signs are BP 150/49  Pulse 59  Ht 5\' 4"  (1.626 m)  Wt 160 lb 12.8 oz (72.938 kg)  BMI 27.59 kg/m2  SpO2 98% General: The patient appears their stated age. HEENT:  No gross abnormalities Pulmonary:  Non labored  breathing Musculoskeletal: There are no major deformities. Neurologic: No focal weakness or paresthesias are detected, Skin: There are no ulcer or rashes noted. Psychiatric: The patient has normal affect. Cardiovascular: There is a regular rate and rhythm without significant murmur. 2+ pitting edema bilaterally appreciated.   Diagnostic Studies Ultrasound images were reviewed today. This shows an ABI that could not be calculated do to calcified vessels on the right. There are triphasic waveforms. Duplex is widely patent right popliteal artery  Assessment: Status post right popliteal embolectomy Plan: As the patient is doing very well this time. She will need continued surveillance of popliteal artery. She has previously undergone angioplasty of the below knee popliteal artery. The area treated correlates with the arteriotomy. Number next ultrasound surveillance will be in one year.  Jorge Ny, M.D. [facility] [place] Office: [phone] Pager:  [phone]

## 2013-08-23 ENCOUNTER — Encounter: Payer: Self-pay | Admitting: Interventional Cardiology

## 2013-08-23 ENCOUNTER — Ambulatory Visit (INDEPENDENT_AMBULATORY_CARE_PROVIDER_SITE_OTHER): Payer: Medicare Other | Admitting: Interventional Cardiology

## 2013-08-23 VITALS — BP 144/62 | HR 67 | Ht 64.0 in | Wt 159.0 lb

## 2013-08-23 DIAGNOSIS — I482 Chronic atrial fibrillation, unspecified: Secondary | ICD-10-CM

## 2013-08-23 DIAGNOSIS — I4891 Unspecified atrial fibrillation: Secondary | ICD-10-CM

## 2013-08-23 DIAGNOSIS — C8590 Non-Hodgkin lymphoma, unspecified, unspecified site: Secondary | ICD-10-CM

## 2013-08-23 DIAGNOSIS — E785 Hyperlipidemia, unspecified: Secondary | ICD-10-CM

## 2013-08-23 DIAGNOSIS — I10 Essential (primary) hypertension: Secondary | ICD-10-CM

## 2013-08-23 DIAGNOSIS — I2510 Atherosclerotic heart disease of native coronary artery without angina pectoris: Secondary | ICD-10-CM

## 2013-08-23 DIAGNOSIS — Z7901 Long term (current) use of anticoagulants: Secondary | ICD-10-CM

## 2013-08-23 DIAGNOSIS — C8589 Other specified types of non-Hodgkin lymphoma, extranodal and solid organ sites: Secondary | ICD-10-CM

## 2013-08-23 NOTE — Patient Instructions (Signed)
Your physician recommends that you continue on your current medications as directed. Please refer to the Current Medication list given to you today.  Your physician recommends that you schedule a follow-up appointment in: 1 year with Dr. Smith.   

## 2013-08-23 NOTE — Progress Notes (Signed)
Patient ID: [name], female   DOB: 1927-05-28, 79 y.o.   MRN: [id number]    HPI She denies cardiac complaints. There is mild to moderate lower extremity edema. That is much more manageable now than several months ago. She denies orthopnea, PND, palpitations, syncope, and near-syncope. She's had no episodes of angina. No medication side effects. She is concerned about the quantity of medications that she takes. Overall, though she feels that things are going quite well. Her appetite is improved since chemotherapy for non-Hodgkin's lymphoma. Allergies  Allergen Reactions  . Zolpidem Tartrate Anxiety    Current Outpatient Prescriptions  Medication Sig Dispense Refill  . Ascorbic Acid (VITAMIN C) 1000 MG tablet Take 1,000 mg by mouth daily.        . cholecalciferol (VITAMIN D) 1000 UNITS tablet Take 1,000 Units by mouth daily.        Marland Kitchen diltiazem (DILACOR XR) 180 MG 24 hr capsule Take 180 mg by mouth 2 (two) times daily.        . furosemide (LASIX) 80 MG tablet Take 80 mg by mouth daily.       Marland Kitchen losartan (COZAAR) 100 MG tablet Take 100 mg by mouth daily.        . metoprolol (TOPROL-XL) 100 MG 24 hr tablet Take 100 mg by mouth daily.        . Multiple Vitamin (MULTIVITAMIN) tablet Take 1 tablet by mouth daily.        . potassium chloride SA (K-DUR,KLOR-CON) 20 MEQ tablet Take 20 mEq by mouth daily.        Marland Kitchen warfarin (COUMADIN) 2.5 MG tablet Take 2.5-3.75 mg by mouth. Take 1.5 tabs on Thursdays, and 1 tablet all other days       No current facility-administered medications for this visit.    Past Medical History  Diagnosis Date  . Irregular heart beat   . CHF (congestive heart failure)   . CAD (coronary artery disease)   . HIV infection   . Chronic kidney disease   . Myocardial infarction   . Peripheral vascular disease   . NHL (non-Hodgkin's lymphoma)     nhl dx 2009    Past Surgical History  Procedure Laterality Date  . Coronary artery bypass graft    . Joint replacement       Right ankle  . Fracture surgery  11/20/2008    Right elbow and wrist  .  abdominal aortagram  03/01/12    Right abdominal Aortaghram PTA Peripheral Artery  . Popliteal artery stent  03/01/12    Right Pop. Artery PTA    ROS: She denies falls, anorexia, weight loss, transient neurological problems, bleeding, on Coumadin, head trauma, blood in stool, urine, and hemoptysis.  PHYSICAL EXAM BP 144/62  Pulse 67  Ht 5\' 4"  (1.626 m)  Wt 159 lb (72.122 kg)  BMI 27.28 kg/m2  SpO2 94% Skin is clear HEENT exam unremarkable Neck exam reveals no JVD or carotid bruits Chest is clear. Auscultation and percussion Cardiac exam reveals an irregularly irregular rhythm. 1 of 6 systolic murmur is heard. Right upper sternal border 2+ bilateral ankle edema with lichenification of skin Neurological exam is unremarkable  EKG: Atrial fibrillation, with controlled rate at 67 beats per minute, prominent voltage, QS pattern V1 through V3, and nonspecific ST abnormality. No change compared to prior tracings.  ASSESSMENT AND PLAN  1. Chronic atrial fibrillation, controlled rate, and no evidence of heart failure.  2. Coronary atherosclerotic heart disease, status post  previous bypass surgery, with LIMA to LAD,  3. Hypertension, with adequate. Blood pressure control.  4. Anticoagulation therapy with no bleeding complications  The patient is stable from cardiac standpoint with rate controlled A. fib and asymptomatic. Coronary disease. She is followed in the Coumadin clinic. I'll see her in one year.

## 2013-09-10 ENCOUNTER — Other Ambulatory Visit: Payer: Self-pay | Admitting: Interventional Cardiology

## 2013-09-19 ENCOUNTER — Ambulatory Visit (INDEPENDENT_AMBULATORY_CARE_PROVIDER_SITE_OTHER): Payer: Medicare Other | Admitting: Pharmacist

## 2013-09-19 DIAGNOSIS — I4891 Unspecified atrial fibrillation: Secondary | ICD-10-CM | POA: Insufficient documentation

## 2013-10-29 ENCOUNTER — Ambulatory Visit (INDEPENDENT_AMBULATORY_CARE_PROVIDER_SITE_OTHER): Payer: Medicare Other | Admitting: Pharmacist

## 2013-10-29 DIAGNOSIS — I4891 Unspecified atrial fibrillation: Secondary | ICD-10-CM

## 2013-10-29 LAB — POCT INR: INR: 2.5

## 2013-11-13 ENCOUNTER — Telehealth: Payer: Self-pay | Admitting: Internal Medicine

## 2013-11-13 NOTE — Telephone Encounter (Signed)
s.w. pt sick and will call back to r/s

## 2013-11-20 ENCOUNTER — Ambulatory Visit (HOSPITAL_COMMUNITY): Payer: Medicare Other

## 2013-11-20 ENCOUNTER — Other Ambulatory Visit: Payer: Medicare Other

## 2013-11-22 ENCOUNTER — Other Ambulatory Visit (HOSPITAL_COMMUNITY): Payer: Medicare Other

## 2013-11-22 ENCOUNTER — Ambulatory Visit: Payer: Medicare Other | Admitting: Internal Medicine

## 2013-12-10 ENCOUNTER — Ambulatory Visit (INDEPENDENT_AMBULATORY_CARE_PROVIDER_SITE_OTHER): Payer: Medicare Other | Admitting: *Deleted

## 2013-12-10 DIAGNOSIS — I4891 Unspecified atrial fibrillation: Secondary | ICD-10-CM

## 2013-12-10 DIAGNOSIS — Z5181 Encounter for therapeutic drug level monitoring: Secondary | ICD-10-CM | POA: Insufficient documentation

## 2013-12-10 LAB — POCT INR: INR: 2.5

## 2013-12-23 ENCOUNTER — Other Ambulatory Visit: Payer: Self-pay | Admitting: Interventional Cardiology

## 2014-01-21 ENCOUNTER — Ambulatory Visit (INDEPENDENT_AMBULATORY_CARE_PROVIDER_SITE_OTHER): Payer: Medicare Other | Admitting: Pharmacist

## 2014-01-21 DIAGNOSIS — Z5181 Encounter for therapeutic drug level monitoring: Secondary | ICD-10-CM

## 2014-01-21 DIAGNOSIS — I4891 Unspecified atrial fibrillation: Secondary | ICD-10-CM

## 2014-01-21 LAB — POCT INR: INR: 2

## 2014-01-30 ENCOUNTER — Other Ambulatory Visit: Payer: Self-pay | Admitting: Interventional Cardiology

## 2014-02-17 ENCOUNTER — Other Ambulatory Visit: Payer: Self-pay | Admitting: Interventional Cardiology

## 2014-02-22 ENCOUNTER — Ambulatory Visit (INDEPENDENT_AMBULATORY_CARE_PROVIDER_SITE_OTHER): Payer: Medicare Other | Admitting: *Deleted

## 2014-02-22 DIAGNOSIS — Z5181 Encounter for therapeutic drug level monitoring: Secondary | ICD-10-CM

## 2014-02-22 DIAGNOSIS — I4891 Unspecified atrial fibrillation: Secondary | ICD-10-CM

## 2014-02-22 LAB — POCT INR: INR: 2.1

## 2014-03-13 ENCOUNTER — Other Ambulatory Visit: Payer: Self-pay | Admitting: *Deleted

## 2014-03-13 DIAGNOSIS — C8590 Non-Hodgkin lymphoma, unspecified, unspecified site: Secondary | ICD-10-CM

## 2014-03-13 NOTE — Progress Notes (Signed)
Pt called to r/s appts she missed 11/2013.  Onc tx schedule and orders placed in EPIC.  SLJ

## 2014-03-15 ENCOUNTER — Telehealth: Payer: Self-pay | Admitting: Internal Medicine

## 2014-03-15 NOTE — Telephone Encounter (Signed)
S/w the pt and she is aware of her appt with dr Julien Nordmann in June.

## 2014-04-04 ENCOUNTER — Encounter (HOSPITAL_COMMUNITY): Payer: Self-pay

## 2014-04-04 ENCOUNTER — Other Ambulatory Visit (HOSPITAL_BASED_OUTPATIENT_CLINIC_OR_DEPARTMENT_OTHER): Payer: Medicare Other

## 2014-04-04 ENCOUNTER — Ambulatory Visit (HOSPITAL_COMMUNITY)
Admission: RE | Admit: 2014-04-04 | Discharge: 2014-04-04 | Disposition: A | Discharge status: Home / Self Care | Payer: Medicare Other | Source: Ambulatory Visit | Attending: Internal Medicine | Admitting: Internal Medicine

## 2014-04-04 ENCOUNTER — Other Ambulatory Visit: Payer: Self-pay | Admitting: Internal Medicine

## 2014-04-04 DIAGNOSIS — J90 Pleural effusion, not elsewhere classified: Secondary | ICD-10-CM | POA: Insufficient documentation

## 2014-04-04 DIAGNOSIS — C8590 Non-Hodgkin lymphoma, unspecified, unspecified site: Secondary | ICD-10-CM

## 2014-04-04 DIAGNOSIS — R911 Solitary pulmonary nodule: Secondary | ICD-10-CM | POA: Insufficient documentation

## 2014-04-04 DIAGNOSIS — I7090 Unspecified atherosclerosis: Secondary | ICD-10-CM | POA: Insufficient documentation

## 2014-04-04 DIAGNOSIS — E041 Nontoxic single thyroid nodule: Secondary | ICD-10-CM | POA: Insufficient documentation

## 2014-04-04 DIAGNOSIS — C8581 Other specified types of non-Hodgkin lymphoma, lymph nodes of head, face, and neck: Secondary | ICD-10-CM

## 2014-04-04 DIAGNOSIS — I517 Cardiomegaly: Secondary | ICD-10-CM | POA: Insufficient documentation

## 2014-04-04 DIAGNOSIS — K5730 Diverticulosis of large intestine without perforation or abscess without bleeding: Secondary | ICD-10-CM | POA: Insufficient documentation

## 2014-04-04 DIAGNOSIS — I6789 Other cerebrovascular disease: Secondary | ICD-10-CM | POA: Insufficient documentation

## 2014-04-04 DIAGNOSIS — J984 Other disorders of lung: Secondary | ICD-10-CM | POA: Insufficient documentation

## 2014-04-04 DIAGNOSIS — C8589 Other specified types of non-Hodgkin lymphoma, extranodal and solid organ sites: Secondary | ICD-10-CM | POA: Insufficient documentation

## 2014-04-04 DIAGNOSIS — G319 Degenerative disease of nervous system, unspecified: Secondary | ICD-10-CM | POA: Insufficient documentation

## 2014-04-04 LAB — COMPREHENSIVE METABOLIC PANEL (CC13)
ALT: 31 U/L (ref 0–55)
ANION GAP: 12 meq/L — AB (ref 3–11)
AST: 30 U/L (ref 5–34)
Albumin: 4 g/dL (ref 3.5–5.0)
Alkaline Phosphatase: 94 U/L (ref 40–150)
BUN: 16 mg/dL (ref 7.0–26.0)
CALCIUM: 9.8 mg/dL (ref 8.4–10.4)
CO2: 27 mEq/L (ref 22–29)
Chloride: 104 mEq/L (ref 98–109)
Creatinine: 0.8 mg/dL (ref 0.6–1.1)
Glucose: 103 mg/dl (ref 70–140)
Potassium: 4 mEq/L (ref 3.5–5.1)
Sodium: 143 mEq/L (ref 136–145)
TOTAL PROTEIN: 6.3 g/dL — AB (ref 6.4–8.3)
Total Bilirubin: 1.22 mg/dL — ABNORMAL HIGH (ref 0.20–1.20)

## 2014-04-04 LAB — LACTATE DEHYDROGENASE (CC13): LDH: 214 U/L (ref 125–245)

## 2014-04-04 LAB — CBC WITH DIFFERENTIAL/PLATELET
BASO%: 0.7 % (ref 0.0–2.0)
Basophils Absolute: 0 10*3/uL (ref 0.0–0.1)
EOS%: 1.6 % (ref 0.0–7.0)
Eosinophils Absolute: 0.1 10*3/uL (ref 0.0–0.5)
HCT: 43 % (ref 34.8–46.6)
HGB: 13.9 g/dL (ref 11.6–15.9)
LYMPH#: 1.2 10*3/uL (ref 0.9–3.3)
LYMPH%: 20.8 % (ref 14.0–49.7)
MCH: 28.2 pg (ref 25.1–34.0)
MCHC: 32.3 g/dL (ref 31.5–36.0)
MCV: 87.2 fL (ref 79.5–101.0)
MONO#: 0.6 10*3/uL (ref 0.1–0.9)
MONO%: 10.8 % (ref 0.0–14.0)
NEUT#: 3.7 10*3/uL (ref 1.5–6.5)
NEUT%: 66.1 % (ref 38.4–76.8)
Platelets: 194 10*3/uL (ref 145–400)
RBC: 4.93 10*6/uL (ref 3.70–5.45)
RDW: 16.6 % — AB (ref 11.2–14.5)
WBC: 5.6 10*3/uL (ref 3.9–10.3)

## 2014-04-04 MED ORDER — IOHEXOL 300 MG/ML  SOLN
80.0000 mL | Freq: Once | INTRAMUSCULAR | Status: AC | PRN
  Administered 2014-04-04: 80 mL via INTRAVENOUS

## 2014-04-10 ENCOUNTER — Encounter: Payer: Self-pay | Admitting: Internal Medicine

## 2014-04-10 ENCOUNTER — Telehealth: Payer: Self-pay | Admitting: Internal Medicine

## 2014-04-10 ENCOUNTER — Ambulatory Visit (HOSPITAL_BASED_OUTPATIENT_CLINIC_OR_DEPARTMENT_OTHER): Payer: Medicare Other | Admitting: Internal Medicine

## 2014-04-10 VITALS — BP 138/63 | HR 61 | Temp 97.4°F | Resp 21 | Ht 64.0 in | Wt 154.5 lb

## 2014-04-10 DIAGNOSIS — C8589 Other specified types of non-Hodgkin lymphoma, extranodal and solid organ sites: Secondary | ICD-10-CM

## 2014-04-10 DIAGNOSIS — C8590 Non-Hodgkin lymphoma, unspecified, unspecified site: Secondary | ICD-10-CM

## 2014-04-10 NOTE — Progress Notes (Signed)
[place] [phone]   [phone]  OFFICE PROGRESS NOTE  Marjorie Smolder, MD [place] [id number]  PRINCIPAL DIAGNOSIS: Stage IV large B-cell non-Hodgkin lymphoma diagnosed in April 2010.   PRIOR THERAPY.:  1. Status post 6 cycles of systemic chemotherapy with CHOP/Rituxan. Last dose was given July 09, 2009. 2. Status post 6 cycles maintenance recurrence in 375 mg/sq. m. Last dose was given September 11, 2010.  CURRENT THERAPY: Observation.  INTERVAL HISTORY: [name] 80 y.o. female returns to the clinic today for routine one year followup visit. The patient is feeling fine today with no specific complaints. She denied having any significant weight loss or night sweats. She denied having any palpable lymphadenopathy. The patient denied having any significant chest pain, shortness breath, cough or hemoptysis. She had repeat CBC, comprehensive to panel, LDH and CT of the head, chest, abdomen and pelvis performed recently and she is here for evaluation and discussion of her lab and the scan results.  MEDICAL HISTORY: Past Medical History  Diagnosis Date  . Irregular heart beat   . CHF (congestive heart failure)   . CAD (coronary artery disease)   . HIV infection   . Chronic kidney disease   . Myocardial infarction   . Peripheral vascular disease   . NHL (non-Hodgkin's lymphoma)     nhl dx 2009    ALLERGIES:  is allergic to zolpidem tartrate.  MEDICATIONS:  Current Outpatient Prescriptions  Medication Sig Dispense Refill  . Ascorbic Acid (VITAMIN C) 1000 MG tablet Take 1,000 mg by mouth daily.        Marland Kitchen atorvastatin (LIPITOR) 20 MG tablet 20 mg daily.      . cholecalciferol (VITAMIN D) 1000 UNITS tablet Take 1,000 Units by mouth daily.        Marland Kitchen diltiazem (DILACOR XR) 180 MG 24 hr capsule TAKE ONE CAPSULE BY MOUTH TWICE DAILY  60 capsule  5  . donepezil (ARICEPT) 5 MG tablet 5 mg daily.        . furosemide (LASIX) 80 MG tablet Take 80 mg by mouth daily.       Marland Kitchen losartan (COZAAR) 100 MG tablet Take 100 mg by mouth daily.        . metoprolol succinate (TOPROL-XL) 100 MG 24 hr tablet TAKE ONE TABLET BY MOUTH ONE TIME DAILY  30 tablet  5  . Multiple Vitamin (MULTIVITAMIN) tablet Take 1 tablet by mouth daily.        . potassium chloride SA (K-DUR,KLOR-CON) 20 MEQ tablet TAKE ONE TABLET BY MOUTH ONE TIME DAILY  30 tablet  10  . warfarin (COUMADIN) 2.5 MG tablet 1 tablet on all days except 1.5 Thursday or as directed by coumadin clinic  100 tablet  1   No current facility-administered medications for this visit.    SURGICAL HISTORY:  Past Surgical History  Procedure Laterality Date  . Coronary artery bypass graft    . Joint replacement      Right ankle  . Fracture surgery  11/20/2008    Right elbow and wrist  .  abdominal aortagram  03/01/12    Right abdominal Aortaghram PTA Peripheral Artery  . Popliteal artery stent  03/01/12    Right Pop. Artery PTA    REVIEW OF SYSTEMS:  A comprehensive review of systems was negative except for: Swelling of the lower extremities   PHYSICAL EXAMINATION: General appearance: alert, cooperative and no distress  Head: Normocephalic, without obvious abnormality, atraumatic Neck: no adenopathy Lymph nodes: Cervical, supraclavicular, and axillary nodes normal. Resp: clear to auscultation bilaterally Cardio: regular rate and rhythm, S1, S2 normal, no murmur, click, rub or gallop GI: soft, non-tender; bowel sounds normal; no masses,  no organomegaly Extremities: extremities normal, atraumatic, no cyanosis or edema Neurologic: Alert and oriented X 3, normal strength and tone. Normal symmetric reflexes. Normal coordination and gait  ECOG PERFORMANCE STATUS: 1 - Symptomatic but completely ambulatory  Blood pressure 138/63, pulse 61, temperature 97.4 F (36.3 C), temperature source Oral, resp. rate 21, height 5\' 4"  (1.626 m), weight 154 lb 8 oz (70.081  kg), SpO2 95.00%.  LABORATORY DATA: Lab Results  Component Value Date   WBC 5.6 04/04/2014   HGB 13.9 04/04/2014   HCT 43.0 04/04/2014   MCV 87.2 04/04/2014   PLT 194 04/04/2014      Chemistry      Component Value Date/Time   NA 143 04/04/2014 1001   NA 140 05/17/2012 0906   NA 142 03/01/2012 1019   K 4.0 04/04/2014 1001   K 3.6 05/17/2012 0906   K 3.7 03/01/2012 1019   CL 106 11/20/2012 1017   CL 94* 05/17/2012 0906   CL 101 03/01/2012 1019   CO2 27 04/04/2014 1001   CO2 34* 05/17/2012 0906   CO2 30 11/15/2011 1023   BUN 16.0 04/04/2014 1001   BUN 22 05/17/2012 0906   BUN 25* 03/01/2012 1019   CREATININE 0.8 04/04/2014 1001   CREATININE 1.0 05/17/2012 0906   CREATININE 0.90 03/01/2012 1019      Component Value Date/Time   CALCIUM 9.8 04/04/2014 1001   CALCIUM 9.0 05/17/2012 0906   CALCIUM 9.9 11/15/2011 1023   ALKPHOS 94 04/04/2014 1001   ALKPHOS 98* 05/17/2012 0906   ALKPHOS 113 11/15/2011 1023   AST 30 04/04/2014 1001   AST 27 05/17/2012 0906   AST 29 11/15/2011 1023   ALT 31 04/04/2014 1001   ALT 22 05/17/2012 0906   ALT 25 11/15/2011 1023   BILITOT 1.22* 04/04/2014 1001   BILITOT 1.00 05/17/2012 0906   BILITOT 0.8 11/15/2011 1023       RADIOGRAPHIC STUDIES: Ct Head W Wo Contrast  04/04/2014   CLINICAL DATA:  Non Hodgkin lymphoma  EXAM: CT HEAD WITHOUT AND WITH CONTRAST  TECHNIQUE: Contiguous axial images were obtained from the base of the skull through the vertex without and with intravenous contrast  CONTRAST:  40mL OMNIPAQUE IOHEXOL 300 MG/ML  SOLN  COMPARISON:    CT head 10/04/2011  FINDINGS: Mild atrophy. Mild to moderate chronic microvascular ischemic change in the white matter, similar to the prior CT. Negative for acute infarct. Negative for hemorrhage or mass.  Normal enhancement following contrast administration.  Atherosclerotic disease.  No focal skull abnormality.  IMPRESSION: Atrophy and chronic microvascular ischemia. No acute abnormality. Negative for intracranial mass lesion.    Electronically Signed   By: Franchot Gallo M.D.   On: 04/04/2014 12:32   Ct Chest W Contrast  04/04/2014   CLINICAL DATA:  Restaging non-Hodgkin's lymphoma  EXAM: CT CHEST, ABDOMEN, AND PELVIS WITH CONTRAST  TECHNIQUE: Multidetector CT imaging of the chest, abdomen and pelvis was performed following the standard protocol during bolus administration of intravenous contrast.  CONTRAST:  3mL OMNIPAQUE IOHEXOL 300 MG/ML  SOLN  COMPARISON:  Multiple exams, including 11/20/2012 and 05/17/2012  FINDINGS:   CT CHEST FINDINGS  Left inferior thyroid hypodense nodule, stable at about 1.7 cm. Scattered small  mediastinal lymph nodes, similar to prior. Moderate right and small left pleural effusions, similar to prior. Atherosclerotic aortic arch, coronary vessels, and branch vasculature, with cardiomegaly, similar to prior. There may be a small component of the right pleural effusion which is loculated anteriorly.  Stable biapical pleural parenchymal scarring. Old granulomatous disease noted. If progressive sclerosis along the posterior superior endplate of T4 with mild associated wedging compatible with compression fracture. Possible extension of this fracture through the base of the left pedicle. Bony demineralization.  I once again observe a 4 mm right middle lobe nodule. This is not changed from 2006 and is considered benign.    CT ABDOMEN AND PELVIS FINDINGS  The liver, spleen, pancreas, and adrenal glands appear unremarkable. No specific gallbladder or biliary abnormality identified. Kidneys and proximal ureters unremarkable. Aortoiliac atherosclerotic vascular disease. No pathologic upper abdominal adenopathy is observed. No pathologic pelvic adenopathy is observed. Sigmoid diverticulosis. Calcifications along the right adnexa, similar to prior, probably from a fibroid.    IMPRESSION: 1. No specific findings of recurrent lymphoma. 2. Mild progressive collapse of the T4 vertebral body associated with superior  endplate compression fracture. This has worsened compared to 11/20/12, and is most likely secondary to osteoporosis. 3. Numerous ancillary findings include a stable left inferior thyroid nodule, aortoiliac atherosclerotic vascular disease, sigmoid diverticulosis, and calcified right uterine fibroid.   Electronically Signed   By: Sherryl Barters M.D.   On: 04/04/2014 14:14    ASSESSMENT AND PLAN: This is a very pleasant 80 years old white female with history of stage IV large B-cell non-Hodgkin lymphoma status post systemic chemotherapy and maintenance Rituxan and has been observation since November of 2011 with no evidence for disease recurrence. The patient is feeling fine today. I discussed the scan and lab result with her.  I recommended for her to continue on observation for now with repeat CT scan of the chest, abdomen and pelvis in one year. She was advised to call me immediately if she has any concerning symptoms in the interval.  All questions were answered. The patient knows to call the clinic with any problems, questions or concerns. We can certainly see the patient much sooner if necessary.  Disclaimer: This note was dictated with voice recognition software. Similar sounding words can inadvertently be transcribed and may not be corrected upon review.

## 2014-04-10 NOTE — Telephone Encounter (Signed)
gv and pritned appts ched and avs for pt for May adn June 2016.....sed gv barium

## 2014-04-15 ENCOUNTER — Ambulatory Visit (INDEPENDENT_AMBULATORY_CARE_PROVIDER_SITE_OTHER): Payer: Medicare Other | Admitting: Surgery

## 2014-04-15 DIAGNOSIS — Z5181 Encounter for therapeutic drug level monitoring: Secondary | ICD-10-CM

## 2014-04-15 DIAGNOSIS — I4891 Unspecified atrial fibrillation: Secondary | ICD-10-CM

## 2014-04-15 LAB — POCT INR: INR: 2.3

## 2014-04-26 ENCOUNTER — Encounter: Payer: Self-pay | Admitting: Family

## 2014-04-29 ENCOUNTER — Ambulatory Visit (INDEPENDENT_AMBULATORY_CARE_PROVIDER_SITE_OTHER)
Admission: RE | Admit: 2014-04-29 | Discharge: 2014-04-29 | Disposition: A | Discharge status: Home / Self Care | Payer: Medicare Other | Source: Ambulatory Visit | Attending: Surgery | Admitting: Surgery

## 2014-04-29 ENCOUNTER — Encounter: Payer: Self-pay | Admitting: Family

## 2014-04-29 ENCOUNTER — Ambulatory Visit (HOSPITAL_COMMUNITY)
Admission: RE | Admit: 2014-04-29 | Discharge: 2014-04-29 | Disposition: A | Discharge status: Home / Self Care | Payer: Medicare Other | Source: Ambulatory Visit | Attending: Family | Admitting: Family

## 2014-04-29 ENCOUNTER — Ambulatory Visit (INDEPENDENT_AMBULATORY_CARE_PROVIDER_SITE_OTHER): Payer: Medicare Other | Admitting: Family

## 2014-04-29 VITALS — BP 150/70 | HR 43 | Resp 14 | Ht 64.0 in | Wt 148.0 lb

## 2014-04-29 DIAGNOSIS — I739 Peripheral vascular disease, unspecified: Secondary | ICD-10-CM | POA: Insufficient documentation

## 2014-04-29 DIAGNOSIS — Z48812 Encounter for surgical aftercare following surgery on the circulatory system: Secondary | ICD-10-CM | POA: Insufficient documentation

## 2014-04-29 DIAGNOSIS — M7989 Other specified soft tissue disorders: Secondary | ICD-10-CM | POA: Insufficient documentation

## 2014-04-29 DIAGNOSIS — R29898 Other symptoms and signs involving the musculoskeletal system: Secondary | ICD-10-CM | POA: Insufficient documentation

## 2014-04-29 NOTE — Progress Notes (Signed)
[facility] HISTORY [facility] PHYSICAL -PAD  History of Present Illness [name] is a 80 y.o. female patient of Dr. Trula Slade. The patient comes back today for followup. She originally underwent left popliteal embolectomy for an ischemic leg in January of 2012. She was found to have a stenosis within the artery by ultrasound. In April 2013 she underwent angioplasty of the right popliteal artery.  She C/O Bilateral leg swelling, duration 6 mo getting worse. Weakness Bilateral leg, on going 2 yrs. She states Lasix keeps the swelling down. Weak feeling in both feet and calves with walking, denies pain, denies non healing wounds. She denies any history of stroke or TIA.  The patient reports New Medical or Surgical History: recent scan shows she is free of non Hodgkin's lymphoma.   Pt Diabetic: No Pt smoker: non-smoker  Pt meds include: Statin :Yes Betablocker: Yes ASA: No Other anticoagulants/antiplatelets: coumadin for atrial fib  Past Medical History  Diagnosis Date  . Irregular heart beat   . CHF (congestive heart failure)   . CAD (coronary artery disease)   . HIV infection   . Chronic kidney disease   . Myocardial infarction   . Peripheral vascular disease   . NHL (non-Hodgkin's lymphoma)     nhl dx 2009    Social History History  Substance Use Topics  . Smoking status: Never Smoker   . Smokeless tobacco: Never Used  . Alcohol Use: No    Family History Family History  Problem Relation Age of Onset  . Cancer Mother   . Heart disease Father     Heart Disease before age 48  . Heart attack Father   . Hypertension Father   . Heart disease Sister     Heart Disease before age 74  . Cancer Sister   . Heart disease Brother     Heart Diseases before age 75  . Heart attack Brother   . Hypertension Brother   . Cancer Brother   . Heart disease Daughter     Heart Disease before age 35  . Heart attack Daughter   . Diabetes Daughter   .  Heart disease Son     Heart Disease before age 47  . Heart attack Son   . Diabetes Son   . Hypertension Son     Past Surgical History  Procedure Laterality Date  . Coronary artery bypass graft    . Joint replacement      Right ankle  . Fracture surgery  11/20/2008    Right elbow and wrist  .  abdominal aortagram  03/01/12    Right abdominal Aortaghram PTA Peripheral Artery  . Popliteal artery stent  03/01/12    Right Pop. Artery PTA    Allergies  Allergen Reactions  . Zolpidem Tartrate Anxiety    Current Outpatient Prescriptions  Medication Sig Dispense Refill  . Ascorbic Acid (VITAMIN C) 1000 MG tablet Take 1,000 mg by mouth daily.        Marland Kitchen atorvastatin (LIPITOR) 20 MG tablet 20 mg daily.      . cholecalciferol (VITAMIN D) 1000 UNITS tablet Take 1,000 Units by mouth daily.        Marland Kitchen diltiazem (DILACOR XR) 180 MG 24 hr capsule TAKE ONE CAPSULE BY MOUTH TWICE DAILY  60 capsule  5  . donepezil (ARICEPT) 5 MG tablet 5 mg daily.      . furosemide (LASIX) 80 MG tablet Take 80 mg by mouth daily.       Marland Kitchen  losartan (COZAAR) 100 MG tablet Take 100 mg by mouth daily.        . metoprolol succinate (TOPROL-XL) 100 MG 24 hr tablet TAKE ONE TABLET BY MOUTH ONE TIME DAILY  30 tablet  5  . Multiple Vitamin (MULTIVITAMIN) tablet Take 1 tablet by mouth daily.        . potassium chloride SA (K-DUR,KLOR-CON) 20 MEQ tablet TAKE ONE TABLET BY MOUTH ONE TIME DAILY  30 tablet  10  . warfarin (COUMADIN) 2.5 MG tablet 1 tablet on all days except 1.5 Thursday or as directed by coumadin clinic  100 tablet  1   No current facility-administered medications for this visit.    ROS: See HPI for pertinent positives and negatives.   Physical Examination   Filed Vitals:   04/29/14 1312  BP: 150/70  Pulse: 43  Resp: 14  Height: 5\' 4"  (1.626 m)  Weight: 148 lb (67.132 kg)  SpO2: 97%   Body mass index is 25.39 kg/(m^2).  General: A&O x 3, WDWN. Gait: slow, deliberate, slightly unsteady Eyes:  PERRLA. Pulmonary: CTAB, without wheezes , rales or rhonchi. Cardiac: regular Rythm , without detected murmur.         Carotid Bruits Left Right   Negative Negative  Aorta is not palpable. Radial pulses: 2+ palpable and =                           VASCULAR EXAM: Extremities without ischemic changes  without Gangrene; without open wounds. Feet appear well perfused. 1+ bilateral pretibial pitting edema.                                                                                                          LE Pulses LEFT RIGHT       FEMORAL  3+ palpable  3+ palpable        POPLITEAL  not palpable   not palpable       POSTERIOR TIBIAL  not palpable   not palpable        DORSALIS PEDIS      ANTERIOR TIBIAL not palpable  not palpable    Abdomen: soft, NT, no masses. Skin: no rashes, no ulcers noted. Musculoskeletal: no muscle wasting or atrophy.  Neurologic: A&O X 3; Appropriate Affect ; SENSATION: normal; MOTOR FUNCTION:  moving all extremities equally, motor strength 4/5 throughout. Speech is fluent/normal. CN 2-12 intact except is hard of hearing. Tremor noted in right outstretched hand. Mild head tremor noted.    Non-Invasive Vascular Imaging: DATE: 04/29/2014 LOWER EXTREMITY ARTERIAL DUPLEX EVALUATION    INDICATION: PVD    PREVIOUS INTERVENTION(S): Right popliteal embolectomy 12/02/2010, angioplasty of the right popliteal artery x 2 03/01/2012 by Dr. Bridgett Larsson.    DUPLEX EXAM: Lower extremity arterial duplex    RIGHT  LEFT   Peak Systolic Velocity (cm/s) Ratio (if abnormal) Waveform  Peak Systolic Velocity (cm/s) Ratio (if abnormal) Waveform  96  B broad Common Femoral Artery     93  B broad Deep Femoral Artery  108  B broad Superficial Femoral Artery Proximal     119  B broad Superficial Femoral Artery Mid     108  B broad Superficial Femoral Artery Distal     73  B broad Popliteal Artery     89  M brisk Posterior Tibial Artery Dist     13  M brisk Anterior Tibial  Artery Distal     15  M brisk Peroneal Artery Distal     [place] Today's ABI / TBI 1.15  [place] Previous ABI / TBI ( 04/23/13 ) .92    Waveform:    M - Monophasic       B - Biphasic       T - Triphasic  If Ankle Brachial Index (ABI) or Toe Brachial Index (TBI) performed, please see complete report     ADDITIONAL FINDINGS: Fluid collection in the right popliteal fossa measuring 4.65 x 1.29 cms.    IMPRESSION: 1. Calcific plaque prevents thorough visualization of the popliteal artery. 2. No focal stenosis of the popliteal artery visualized.       ASSESSMENT: [name] is a 80 y.o. female who is s/p left popliteal embolectomy for an ischemic leg in January of 2012. She was found to have a stenosis within the artery by ultrasound. In April 2013 she underwent angioplasty of the right popliteal artery.  On Duplex today:  Calcific plaque prevents thorough visualization of the popliteal artery. No focal stenosis of the popliteal artery visualized. Right DP and PT arteries remain non compressible. The left DP ABI waveform is monophasic indicating arterial occlusive disease even though the ABI is normal.  It is unlikely that the weakness she experiences in her feet and calves is related to arterial occlusive disease, her symptoms are not typical of claudication. Her pedal pulses are not palpable but she has biphasic waveforms on ABI's.  Non vascular fluid collection in right popliteal fossa, most likely a Baker's Cyst, pt advised to follow up with her PCP re this.  PLAN:  I discussed in depth with the patient the nature of atherosclerosis, and emphasized the importance of maximal medical management including strict control of blood pressure, blood glucose, and lipid levels, obtaining regular exercise, and continued cessation of smoking.  The patient is aware that without maximal medical management the underlying atherosclerotic disease process will progress, limiting the benefit of any  interventions.  Based on the patient's vascular studies and examination, pt will return to clinic in 1 year for ABI's and right LE arterial Duplex.  The patient was given information about PAD including signs, symptoms, treatment, what symptoms should prompt the patient to seek immediate medical care, and risk reduction measures to take.  Clemon Chambers, RN, MSN, FNP-C [facility] Phone: [phone]  Clinic MD: Trula Slade  04/29/2014 1:23 PM

## 2014-04-29 NOTE — Patient Instructions (Signed)

## 2014-05-20 ENCOUNTER — Other Ambulatory Visit: Payer: Self-pay | Admitting: Interventional Cardiology

## 2014-05-21 ENCOUNTER — Emergency Department (HOSPITAL_COMMUNITY)
Admission: EM | Admit: 2014-05-21 | Discharge: 2014-05-21 | Disposition: A | Discharge status: Home / Self Care | Payer: Medicare Other | Attending: Emergency Medicine | Admitting: Emergency Medicine

## 2014-05-21 ENCOUNTER — Encounter (HOSPITAL_COMMUNITY): Payer: Medicare Other

## 2014-05-21 ENCOUNTER — Encounter (HOSPITAL_COMMUNITY): Payer: Self-pay | Admitting: Emergency Medicine

## 2014-05-21 DIAGNOSIS — N189 Chronic kidney disease, unspecified: Secondary | ICD-10-CM | POA: Insufficient documentation

## 2014-05-21 DIAGNOSIS — I252 Old myocardial infarction: Secondary | ICD-10-CM | POA: Insufficient documentation

## 2014-05-21 DIAGNOSIS — Z7901 Long term (current) use of anticoagulants: Secondary | ICD-10-CM | POA: Insufficient documentation

## 2014-05-21 DIAGNOSIS — Z951 Presence of aortocoronary bypass graft: Secondary | ICD-10-CM | POA: Insufficient documentation

## 2014-05-21 DIAGNOSIS — I509 Heart failure, unspecified: Secondary | ICD-10-CM | POA: Insufficient documentation

## 2014-05-21 DIAGNOSIS — M79609 Pain in unspecified limb: Secondary | ICD-10-CM | POA: Insufficient documentation

## 2014-05-21 DIAGNOSIS — Z79899 Other long term (current) drug therapy: Secondary | ICD-10-CM | POA: Insufficient documentation

## 2014-05-21 DIAGNOSIS — M79604 Pain in right leg: Secondary | ICD-10-CM

## 2014-05-21 DIAGNOSIS — Z9889 Other specified postprocedural states: Secondary | ICD-10-CM | POA: Insufficient documentation

## 2014-05-21 DIAGNOSIS — Z87898 Personal history of other specified conditions: Secondary | ICD-10-CM | POA: Insufficient documentation

## 2014-05-21 DIAGNOSIS — Z86718 Personal history of other venous thrombosis and embolism: Secondary | ICD-10-CM

## 2014-05-21 DIAGNOSIS — I2510 Atherosclerotic heart disease of native coronary artery without angina pectoris: Secondary | ICD-10-CM | POA: Insufficient documentation

## 2014-05-21 LAB — CBC WITH DIFFERENTIAL/PLATELET
BASOS ABS: 0 10*3/uL (ref 0.0–0.1)
BASOS PCT: 1 % (ref 0–1)
Eosinophils Absolute: 0.1 10*3/uL (ref 0.0–0.7)
Eosinophils Relative: 1 % (ref 0–5)
HCT: 42.1 % (ref 36.0–46.0)
Hemoglobin: 13.7 g/dL (ref 12.0–15.0)
LYMPHS PCT: 21 % (ref 12–46)
Lymphs Abs: 1.3 10*3/uL (ref 0.7–4.0)
MCH: 28.5 pg (ref 26.0–34.0)
MCHC: 32.5 g/dL (ref 30.0–36.0)
MCV: 87.7 fL (ref 78.0–100.0)
MONOS PCT: 9 % (ref 3–12)
Monocytes Absolute: 0.6 10*3/uL (ref 0.1–1.0)
NEUTROS ABS: 4.4 10*3/uL (ref 1.7–7.7)
NEUTROS PCT: 68 % (ref 43–77)
Platelets: 225 10*3/uL (ref 150–400)
RBC: 4.8 MIL/uL (ref 3.87–5.11)
RDW: 16.7 % — ABNORMAL HIGH (ref 11.5–15.5)
WBC: 6.4 10*3/uL (ref 4.0–10.5)

## 2014-05-21 LAB — BASIC METABOLIC PANEL
ANION GAP: 18 — AB (ref 5–15)
BUN: 20 mg/dL (ref 6–23)
CHLORIDE: 99 meq/L (ref 96–112)
CO2: 23 meq/L (ref 19–32)
CREATININE: 0.82 mg/dL (ref 0.50–1.10)
Calcium: 9.6 mg/dL (ref 8.4–10.5)
GFR calc Af Amer: 74 mL/min — ABNORMAL LOW (ref >90)
GFR calc non Af Amer: 63 mL/min — ABNORMAL LOW (ref >90)
Glucose, Bld: 113 mg/dL — ABNORMAL HIGH (ref 70–99)
POTASSIUM: 3.6 meq/L — AB (ref 3.7–5.3)
Sodium: 140 mEq/L (ref 137–147)

## 2014-05-21 LAB — PROTIME-INR
INR: 1.88 — ABNORMAL HIGH (ref 0.00–1.49)
Prothrombin Time: 21.6 seconds — ABNORMAL HIGH (ref 11.6–15.2)

## 2014-05-21 NOTE — ED Notes (Signed)
Discharge instructions reviewed with pt. Pt verbalized understanding.   

## 2014-05-21 NOTE — ED Provider Notes (Signed)
CSN: [id number]     Arrival date & time 05/21/14  0014 History   First MD Initiated Contact with Patient 05/21/14 0033     Chief Complaint  Patient presents with  . Leg Pain  . DVT     (Consider location/radiation/quality/duration/timing/severity/associated sxs/prior Treatment) HPI Comments: Patient is an 80 year old female with past medical history of CAD, CHF, A. fib. She has also had a prior DVT. She presents today with complaints of numbness and discomfort in the back of her right leg. She denies any injury or trauma. She denies any chest pain or shortness of breath.  Patient is a 80 y.o. female presenting with leg pain. The history is provided by the patient.  Leg Pain Location:  Leg Time since incident:  1 week Injury: no   Leg location:  R leg Pain details:    Quality:  Aching   Severity:  Moderate   Onset quality:  Gradual   Duration:  1 week   Timing:  Intermittent   Progression:  Worsening Chronicity:  New   Past Medical History  Diagnosis Date  . Irregular heart beat   . CHF (congestive heart failure)   . CAD (coronary artery disease)   . HIV infection   . Chronic kidney disease   . Myocardial infarction   . Peripheral vascular disease   . NHL (non-Hodgkin's lymphoma)     nhl dx 2009   Past Surgical History  Procedure Laterality Date  . Coronary artery bypass graft    . Joint replacement      Right ankle  . Fracture surgery  11/20/2008    Right elbow and wrist  .  abdominal aortagram  03/01/12    Right abdominal Aortaghram PTA Peripheral Artery  . Popliteal artery stent  03/01/12    Right Pop. Artery PTA   Family History  Problem Relation Age of Onset  . Cancer Mother   . Heart disease Father     Heart Disease before age 66  . Heart attack Father   . Hypertension Father   . Heart disease Sister     Heart Disease before age 64  . Cancer Sister   . Heart disease Brother     Heart Diseases before age 21  . Heart attack Brother   . Hypertension  Brother   . Cancer Brother   . Heart disease Daughter     Heart Disease before age 1  . Heart attack Daughter   . Diabetes Daughter   . Heart disease Son     Heart Disease before age 32  . Heart attack Son   . Diabetes Son   . Hypertension Son    History  Substance Use Topics  . Smoking status: Never Smoker   . Smokeless tobacco: Never Used  . Alcohol Use: No   OB History   Grav Para Term Preterm Abortions TAB SAB Ect Mult Living                 Review of Systems  All other systems reviewed and are negative.     Allergies  Zolpidem tartrate  Home Medications   Prior to Admission medications   Medication Sig Start Date End Date Taking? Authorizing Provider  Ascorbic Acid (VITAMIN C) 1000 MG tablet Take 1,000 mg by mouth daily.      Historical Provider, MD  atorvastatin (LIPITOR) 20 MG tablet 20 mg daily. 02/14/14   Historical Provider, MD  cholecalciferol (VITAMIN D) 1000 UNITS tablet Take 1,000  Units by mouth daily.      Historical Provider, MD  diltiazem (DILACOR XR) 180 MG 24 hr capsule TAKE ONE CAPSULE BY MOUTH TWICE DAILY    Belva Crome III, MD  donepezil (ARICEPT) 5 MG tablet 5 mg daily. 03/25/14   Historical Provider, MD  furosemide (LASIX) 80 MG tablet Take 80 mg by mouth daily.     Historical Provider, MD  losartan (COZAAR) 100 MG tablet Take 100 mg by mouth daily.      Historical Provider, MD  metoprolol succinate (TOPROL-XL) 100 MG 24 hr tablet TAKE ONE TABLET BY MOUTH ONE TIME DAILY    Belva Crome III, MD  Multiple Vitamin (MULTIVITAMIN) tablet Take 1 tablet by mouth daily.      Historical Provider, MD  potassium chloride SA (K-DUR,KLOR-CON) 20 MEQ tablet TAKE ONE TABLET BY MOUTH ONE TIME DAILY 09/10/13   Belva Crome III, MD  warfarin (COUMADIN) 2.5 MG tablet 1 tablet on all days except 1.5 Thursday or as directed by coumadin clinic 02/18/14   Belva Crome III, MD   BP 190/120  Pulse 70  Temp(Src) 98.3 F (36.8 C) (Oral)  Resp 17  SpO2 94% Physical  Exam  Nursing note and vitals reviewed. Constitutional: She is oriented to person, place, and time. She appears well-developed and well-nourished. No distress.  HENT:  Head: Normocephalic and atraumatic.  Neck: Normal range of motion. Neck supple.  Cardiovascular: Normal rate and regular rhythm.  Exam reveals no gallop and no friction rub.   No murmur heard. Pulmonary/Chest: Effort normal and breath sounds normal. No respiratory distress. She has no wheezes.  Abdominal: Soft. Bowel sounds are normal. She exhibits no distension. There is no tenderness.  Musculoskeletal: Normal range of motion.  The right leg appears grossly normal. There is no significant swelling. There is no calf tenderness. Homans sign is absent bilaterally.  Neurological: She is alert and oriented to person, place, and time.  Skin: Skin is warm and dry. She is not diaphoretic.    ED Course  Procedures (including critical care time) Labs Review Labs Reviewed  CBC WITH DIFFERENTIAL  BASIC METABOLIC PANEL  PROTIME-INR    Imaging Review No results found.   EKG Interpretation None      MDM   Final diagnoses:  None    Patient presents with complaints of leg pain for one week in the absence of any injury or trauma. She has a history of DVT and is currently taking Coumadin. Her INR tonight is 1.9. She is concerned she has another blood clot. As there is no vascular lab this evening, she will return in the morning for an ultrasound to rule out this possibility. Her laboratory studies are unremarkable otherwise.    Veryl Speak, MD 05/21/14 [phone]

## 2014-05-21 NOTE — Discharge Instructions (Signed)
Return to the vascular lab tomorrow for ultrasound to rule out blood clot in your leg.  Continue Coumadin as before.  Followup with your primary doctor if not improving in the next week.   Venous Thromboembolism, Prevention A venous thromboembolism is a blood clot that forms in a vein. A blood clot in a deep vein is called a deep venous thrombosis (DVT). A blood clot in the lungs is called a pulmonary embolism (PE). Blood clots are dangerous and can cause death. Blood clots can form in the:  Lungs.  Legs.  Arms. CAUSES  A blood clot can form in a vein from different conditions. A blood clot can develop due to:  Blood flow within a vein that is sluggish or very slow.  Medical conditions that make the blood clot easily.  Vein damage. RISK FACTORS Risk factors can increase your risk of developing a blood clot. Risk factors can include:  Smoking.  Obesity.  Age.  Immobility or sedentary lifestyle.  Sitting or standing for long periods of time.  Chronic or long-term bedrest.  Medical or past history of blood clots.  Family history of blood clots.  Hip, leg, or pelvis injury or trauma.  Major surgery, especially surgery on the hip, knee, or abdomen.  Pregnancy and childbirth.  Birth control pills and hormone replacement therapy.  Medical conditions such as  Peripheral vascular disease (PVD).  Diabetes.  Cancer. SYMPTOMS  Symptoms of VTE can depend on where the clot is located and if the clot breaks off and travels to another organ. Sometimes, there may be no symptoms.   DVT symptoms can include:  Swelling of the leg or arm, especially on one side.  Warmth and redness of the leg or arm, especially on one side.  Pain in an arm or leg. Leg pain may be more noticeable or worse when standing or walking.  PE symptoms can include:  Shortness of breath.  Coughing.  Coughing up blood or blood-tinged mucus (hemoptysis).  Chest pain or chest pain with deep  breaths (pleuritic chest pain).  Apprehension, anxiety, or a feeling of impending doom.  Rapid heartbeat. PREVENTION  Exercise regularly. Take a brisk 30 minute walk every day. Staying active and moving around can help prevent blood clots.  Avoid sitting or lying in bed for long periods of time. Change your position often, especially during a long trip.  Women, especially those over the age of 56, should consider the risks and benefits of taking estrogen medicines. This includes birth control pills and hormone replacement therapy.  Do not smoke, especially if you take estrogen medicines. If you smoke, talk to your caregiver on how to quit.  Eat plenty of fruits and vegetables. Ask your caregiver or dietitian if there are foods you should avoid.  Maintain a weight as suggested by your caregiver.  Wear loose-fitting clothing. Avoid constrictive or tight clothing around your legs or waist.  Try not to bump or injure your legs. Avoid crossing your legs when you are sitting.  Do not use pillows under your knees unless told by your caregiver.  Take all medicines that your caregiver prescribes you.  Wear special stockings (compression stockings or TED hose) if your caregiver prescribes them.  Wearing compression stockings (support hose) can make the leg veins more narrow. This increases blood flow in the legs and can help prevent blood clots.  It is important to wear compression stockings correctly. Do not let them bunch up when you are wearing them. TRAVEL Long  distance travel can increase the risk of a blood clot. To prevent a blood clot when traveling:  You should exercise your legs by walking or by pumping your muscles every hour. To help prevent poor circulation on long trips, stand, stretch, and walk up and down the aisle of your airplane, train, or bus as often as possible to get the blood moving.  Do squats if you are able. If you are unable to do squats, raise your foot on the  balls of your feet and tighten your lower leg muscles (particularly the calve muscles) while seated. Pointing (flexing and extending) your toes while tightening your calves while seated are also good exercises to do every hour during long trips. They help increase blood flow and reduce risk of DVT.  Stay well hydrated. Drink water regularly when traveling, especially when you are sitting or immobile for long periods of time.  Use of drugs to prevent DVT during routine travel is not generally recommended. Before taking any drugs to reduce risk of DVT, consult your caregiver. SURGERY AND HOSPITALIZATION  People who are at high risk for a blood clot may be given a blood thinning medicine (anticoagulant) when they are hospitalized even if they are not going to have surgery.  A long trip prior to surgery can increase the risk of a clot for patients undergoing hip and knee replacements. Talk to your caregiver about travel plans before your surgery.  After hip or knee surgery, your caregiver may give you anticoagulants to help prevent blood clots.  Anticoagulants may be given to people at high risk of developing thromboembolism, before, during, or sometimes after surgery, including people with clotting disorders or with a history of past thromboembolism. TRAVEL AFTER SURGERY  In orthopedic surgery, the cutting of bones prompts the body to increase clotting factors in the blood. Due to the size of the bones involved in hip and knee replacements, there is a higher risk of blood clotting than other orthopedic surgeries.  There is a risk of clotting for up to 4-6 weeks after surgery. Flying or traveling long distances can increase your risk of a clot. As a result, those who travel long distances may need additional preventive measures after their procedure.  Drink only non-alcoholic beverages during your flight, train, or car travel. Alcohol can dehydrate you and increase your risk of getting blood  clots. SEEK IMMEDIATE MEDICAL CARE IF:   You develop chest pain.  You develop severe shortness of breath.  You have breathing problems after traveling.  You develop swelling or pain in the leg.  You begin to cough up bloody mucus or phlegm (sputum).  You feel dizzy or faint. Document Released: 10/13/2009 Document Revised: 07/19/2012 Document Reviewed: 10/13/2009 [facility] Patient Information 2015 [place], [place]. This information is not intended to replace advice given to you by your health care provider. Make sure you discuss any questions you have with your health care provider.

## 2014-05-21 NOTE — ED Notes (Signed)
Patient has been having intermittent leg pain for approximately 1 week. Tonight the leg pain came and remained constant. Describes pain as a pins and needles and reports it feel like her last DVT in same leg. Patient reports her leg is cooler to touch than normal. Pain is now a 5/10 but initially was a 10/10. Patient has full CNS and palpable pedal pulses bilaterally. Hx of a-fib. Denies any other symptoms.

## 2014-05-22 ENCOUNTER — Ambulatory Visit
Admission: RE | Admit: 2014-05-22 | Discharge: 2014-05-22 | Disposition: A | Discharge status: Home / Self Care | Payer: Medicare Other | Source: Ambulatory Visit | Attending: Family Medicine | Admitting: Family Medicine

## 2014-05-22 ENCOUNTER — Other Ambulatory Visit: Payer: Self-pay | Admitting: Family Medicine

## 2014-05-22 DIAGNOSIS — R609 Edema, unspecified: Secondary | ICD-10-CM

## 2014-05-27 ENCOUNTER — Ambulatory Visit (INDEPENDENT_AMBULATORY_CARE_PROVIDER_SITE_OTHER): Payer: Medicare Other

## 2014-05-27 DIAGNOSIS — Z5181 Encounter for therapeutic drug level monitoring: Secondary | ICD-10-CM

## 2014-05-27 DIAGNOSIS — I4891 Unspecified atrial fibrillation: Secondary | ICD-10-CM

## 2014-05-27 LAB — POCT INR: INR: 3.1

## 2014-06-20 ENCOUNTER — Ambulatory Visit: Payer: Medicare Other | Admitting: Neurology

## 2014-06-21 ENCOUNTER — Other Ambulatory Visit: Payer: Self-pay | Admitting: Interventional Cardiology

## 2014-06-24 ENCOUNTER — Other Ambulatory Visit: Payer: Self-pay | Admitting: *Deleted

## 2014-06-24 ENCOUNTER — Ambulatory Visit (INDEPENDENT_AMBULATORY_CARE_PROVIDER_SITE_OTHER): Payer: Medicare Other

## 2014-06-24 ENCOUNTER — Telehealth: Payer: Self-pay | Admitting: Neurology

## 2014-06-24 DIAGNOSIS — Z5181 Encounter for therapeutic drug level monitoring: Secondary | ICD-10-CM

## 2014-06-24 DIAGNOSIS — I4891 Unspecified atrial fibrillation: Secondary | ICD-10-CM

## 2014-06-24 LAB — POCT INR: INR: 2.7

## 2014-06-24 MED ORDER — METOPROLOL SUCCINATE ER 100 MG PO TB24: ORAL_TABLET | ORAL | Status: AC

## 2014-06-24 NOTE — Telephone Encounter (Signed)
Pt called to cancel her NP appt with Dr. Posey Pronto for 06/20/14. Ref provider was notified on 06/20/14 via telephone.

## 2014-06-25 ENCOUNTER — Other Ambulatory Visit: Payer: Self-pay | Admitting: Family Medicine

## 2014-06-25 DIAGNOSIS — F0390 Unspecified dementia without behavioral disturbance: Secondary | ICD-10-CM

## 2014-07-22 ENCOUNTER — Ambulatory Visit (INDEPENDENT_AMBULATORY_CARE_PROVIDER_SITE_OTHER): Payer: Medicare Other | Admitting: Pharmacist

## 2014-07-22 DIAGNOSIS — I4891 Unspecified atrial fibrillation: Secondary | ICD-10-CM

## 2014-07-22 DIAGNOSIS — Z5181 Encounter for therapeutic drug level monitoring: Secondary | ICD-10-CM

## 2014-07-22 LAB — POCT INR: INR: 4.9

## 2014-08-02 ENCOUNTER — Ambulatory Visit (INDEPENDENT_AMBULATORY_CARE_PROVIDER_SITE_OTHER): Payer: Medicare Other | Admitting: Pharmacist

## 2014-08-02 DIAGNOSIS — Z5181 Encounter for therapeutic drug level monitoring: Secondary | ICD-10-CM

## 2014-08-02 DIAGNOSIS — I4891 Unspecified atrial fibrillation: Secondary | ICD-10-CM

## 2014-08-02 LAB — POCT INR: INR: 2.1

## 2014-08-08 DEATH — deceased

## 2014-08-12 ENCOUNTER — Other Ambulatory Visit: Payer: Self-pay | Admitting: Interventional Cardiology

## 2014-08-22 ENCOUNTER — Other Ambulatory Visit: Payer: Self-pay | Admitting: Interventional Cardiology

## 2014-08-27 ENCOUNTER — Ambulatory Visit (INDEPENDENT_AMBULATORY_CARE_PROVIDER_SITE_OTHER): Payer: Medicare Other | Admitting: *Deleted

## 2014-08-27 DIAGNOSIS — Z5181 Encounter for therapeutic drug level monitoring: Secondary | ICD-10-CM

## 2014-08-27 DIAGNOSIS — I4891 Unspecified atrial fibrillation: Secondary | ICD-10-CM

## 2014-08-27 LAB — POCT INR: INR: 1.7

## 2014-09-08 DEATH — deceased

## 2014-09-12 ENCOUNTER — Ambulatory Visit: Payer: Medicare Other | Admitting: Interventional Cardiology

## 2014-09-26 ENCOUNTER — Encounter: Payer: Self-pay | Admitting: Interventional Cardiology

## 2014-10-17 ENCOUNTER — Encounter (HOSPITAL_COMMUNITY): Payer: Self-pay | Admitting: Vascular Surgery

## 2015-02-27 ENCOUNTER — Ambulatory Visit: Payer: Self-pay | Admitting: Interventional Cardiology

## 2015-02-27 DIAGNOSIS — I4891 Unspecified atrial fibrillation: Secondary | ICD-10-CM

## 2015-02-27 DIAGNOSIS — Z5181 Encounter for therapeutic drug level monitoring: Secondary | ICD-10-CM

## 2015-04-08 ENCOUNTER — Other Ambulatory Visit: Payer: Medicare Other

## 2015-04-08 ENCOUNTER — Ambulatory Visit (HOSPITAL_COMMUNITY): Payer: Medicare Other

## 2015-04-10 ENCOUNTER — Telehealth: Payer: Self-pay | Admitting: Medical Oncology

## 2015-04-10 NOTE — Telephone Encounter (Signed)
Pt did not show up for labs, I called Dr Butch Penny [facility] and they have note from September 04, 2014  that pt is deceased

## 2015-04-11 ENCOUNTER — Telehealth: Payer: Self-pay | Admitting: Internal Medicine

## 2015-04-11 NOTE — Telephone Encounter (Signed)
cx appt per pof due to pt deceased

## 2015-04-15 ENCOUNTER — Ambulatory Visit: Payer: Medicare Other | Admitting: Internal Medicine

## 2015-04-25 ENCOUNTER — Encounter: Payer: Self-pay | Admitting: Family

## 2015-04-30 ENCOUNTER — Encounter (HOSPITAL_COMMUNITY): Payer: Medicare Other

## 2015-04-30 ENCOUNTER — Ambulatory Visit: Payer: Medicare Other | Admitting: Family

## 2015-04-30 ENCOUNTER — Inpatient Hospital Stay (HOSPITAL_COMMUNITY): Admission: RE | Admit: 2015-04-30 | Payer: Medicare Other | Source: Ambulatory Visit
